# Patient Record
Sex: Female | Born: 1955 | Race: Black or African American | Hispanic: No | Smoking: Never smoker
Health system: Southern US, Community
[De-identification: ages and names within clinical notes are randomized; demographics above are authoritative.]

## PROBLEM LIST (undated history)

## (undated) DIAGNOSIS — Z923 Personal history of irradiation: Secondary | ICD-10-CM

## (undated) HISTORY — PX: BREAST BIOPSY: SHX20

## (undated) HISTORY — PX: BREAST LUMPECTOMY: SHX2

---

## 1999-05-29 ENCOUNTER — Encounter: Payer: Self-pay | Admitting: Obstetrics

## 1999-05-29 ENCOUNTER — Inpatient Hospital Stay (HOSPITAL_COMMUNITY): Admission: AD | Admit: 1999-05-29 | Discharge: 1999-05-29 | Payer: Self-pay | Admitting: Obstetrics

## 2001-07-16 ENCOUNTER — Encounter: Payer: Self-pay | Admitting: Specialist

## 2001-07-16 ENCOUNTER — Ambulatory Visit (HOSPITAL_COMMUNITY): Admission: RE | Admit: 2001-07-16 | Discharge: 2001-07-16 | Payer: Self-pay | Admitting: Specialist

## 2001-11-21 ENCOUNTER — Emergency Department (HOSPITAL_COMMUNITY): Admission: EM | Admit: 2001-11-21 | Discharge: 2001-11-21 | Payer: Self-pay | Admitting: *Deleted

## 2002-08-04 ENCOUNTER — Ambulatory Visit (HOSPITAL_COMMUNITY): Admission: RE | Admit: 2002-08-04 | Discharge: 2002-08-04 | Payer: Self-pay | Admitting: Specialist

## 2002-08-04 ENCOUNTER — Encounter: Payer: Self-pay | Admitting: Specialist

## 2005-02-10 ENCOUNTER — Ambulatory Visit (HOSPITAL_COMMUNITY): Admission: RE | Admit: 2005-02-10 | Discharge: 2005-02-10 | Payer: Self-pay | Admitting: Specialist

## 2007-12-09 DIAGNOSIS — C50919 Malignant neoplasm of unspecified site of unspecified female breast: Secondary | ICD-10-CM

## 2007-12-09 HISTORY — DX: Malignant neoplasm of unspecified site of unspecified female breast: C50.919

## 2008-10-25 ENCOUNTER — Encounter (INDEPENDENT_AMBULATORY_CARE_PROVIDER_SITE_OTHER): Payer: Self-pay | Admitting: Diagnostic Radiology

## 2008-10-25 ENCOUNTER — Encounter: Admission: RE | Admit: 2008-10-25 | Discharge: 2008-10-25 | Payer: Self-pay | Admitting: Obstetrics and Gynecology

## 2008-11-03 ENCOUNTER — Encounter: Admission: RE | Admit: 2008-11-03 | Discharge: 2008-11-03 | Payer: Self-pay | Admitting: Obstetrics and Gynecology

## 2008-11-24 ENCOUNTER — Encounter: Admission: RE | Admit: 2008-11-24 | Discharge: 2008-11-24 | Payer: Self-pay | Admitting: General Surgery

## 2008-11-28 ENCOUNTER — Encounter: Admission: RE | Admit: 2008-11-28 | Discharge: 2008-11-28 | Payer: Self-pay | Admitting: General Surgery

## 2008-11-28 ENCOUNTER — Ambulatory Visit (HOSPITAL_BASED_OUTPATIENT_CLINIC_OR_DEPARTMENT_OTHER): Admission: RE | Admit: 2008-11-28 | Discharge: 2008-11-28 | Payer: Self-pay | Admitting: General Surgery

## 2008-11-28 ENCOUNTER — Encounter (INDEPENDENT_AMBULATORY_CARE_PROVIDER_SITE_OTHER): Payer: Self-pay | Admitting: General Surgery

## 2008-12-14 ENCOUNTER — Ambulatory Visit: Payer: Self-pay | Admitting: Oncology

## 2008-12-22 ENCOUNTER — Ambulatory Visit: Admission: RE | Admit: 2008-12-22 | Discharge: 2009-03-06 | Payer: Self-pay | Admitting: Radiation Oncology

## 2008-12-22 LAB — COMPREHENSIVE METABOLIC PANEL
BUN: 16 mg/dL (ref 6–23)
CO2: 26 mEq/L (ref 19–32)
Calcium: 9.8 mg/dL (ref 8.4–10.5)
Creatinine, Ser: 0.82 mg/dL (ref 0.40–1.20)
Total Protein: 7.4 g/dL (ref 6.0–8.3)

## 2008-12-22 LAB — CBC WITH DIFFERENTIAL/PLATELET
Eosinophils Absolute: 0.1 10*3/uL (ref 0.0–0.5)
MONO#: 0.4 10*3/uL (ref 0.1–0.9)
MONO%: 10.1 % (ref 0.0–13.0)
Platelets: 310 10*3/uL (ref 145–400)
RDW: 12.6 % (ref 11.3–14.5)
WBC: 4.2 10*3/uL (ref 3.9–10.0)

## 2009-04-10 ENCOUNTER — Ambulatory Visit: Payer: Self-pay | Admitting: Oncology

## 2009-08-23 ENCOUNTER — Ambulatory Visit: Payer: Self-pay | Admitting: Oncology

## 2009-08-27 LAB — COMPREHENSIVE METABOLIC PANEL
AST: 18 U/L (ref 0–37)
Albumin: 3.9 g/dL (ref 3.5–5.2)
Alkaline Phosphatase: 44 U/L (ref 39–117)
CO2: 28 mEq/L (ref 19–32)
Calcium: 9.3 mg/dL (ref 8.4–10.5)
Chloride: 108 mEq/L (ref 96–112)
Sodium: 140 mEq/L (ref 135–145)
Total Bilirubin: 0.4 mg/dL (ref 0.3–1.2)
Total Protein: 7.4 g/dL (ref 6.0–8.3)

## 2009-08-27 LAB — CBC WITH DIFFERENTIAL/PLATELET
Basophils Absolute: 0 10*3/uL (ref 0.0–0.1)
Eosinophils Absolute: 0 10*3/uL (ref 0.0–0.5)
HCT: 37.4 % (ref 34.8–46.6)
MCH: 29.7 pg (ref 25.1–34.0)
MCV: 87.5 fL (ref 79.5–101.0)
MONO#: 0.4 10*3/uL (ref 0.1–0.9)
NEUT%: 63 % (ref 38.4–76.8)
RBC: 4.27 10*6/uL (ref 3.70–5.45)
lymph#: 0.9 10*3/uL (ref 0.9–3.3)

## 2009-10-15 ENCOUNTER — Encounter: Admission: RE | Admit: 2009-10-15 | Discharge: 2009-10-15 | Payer: Self-pay | Admitting: Oncology

## 2009-12-04 IMAGING — CR DG CHEST 2V
2 series · 2 of 2 positions shown · non-contrast
Comparison: None

CLINICAL DATA: Preop workup.  Right breast DCIS

CHEST - 2 VIEW

[w chest pa]
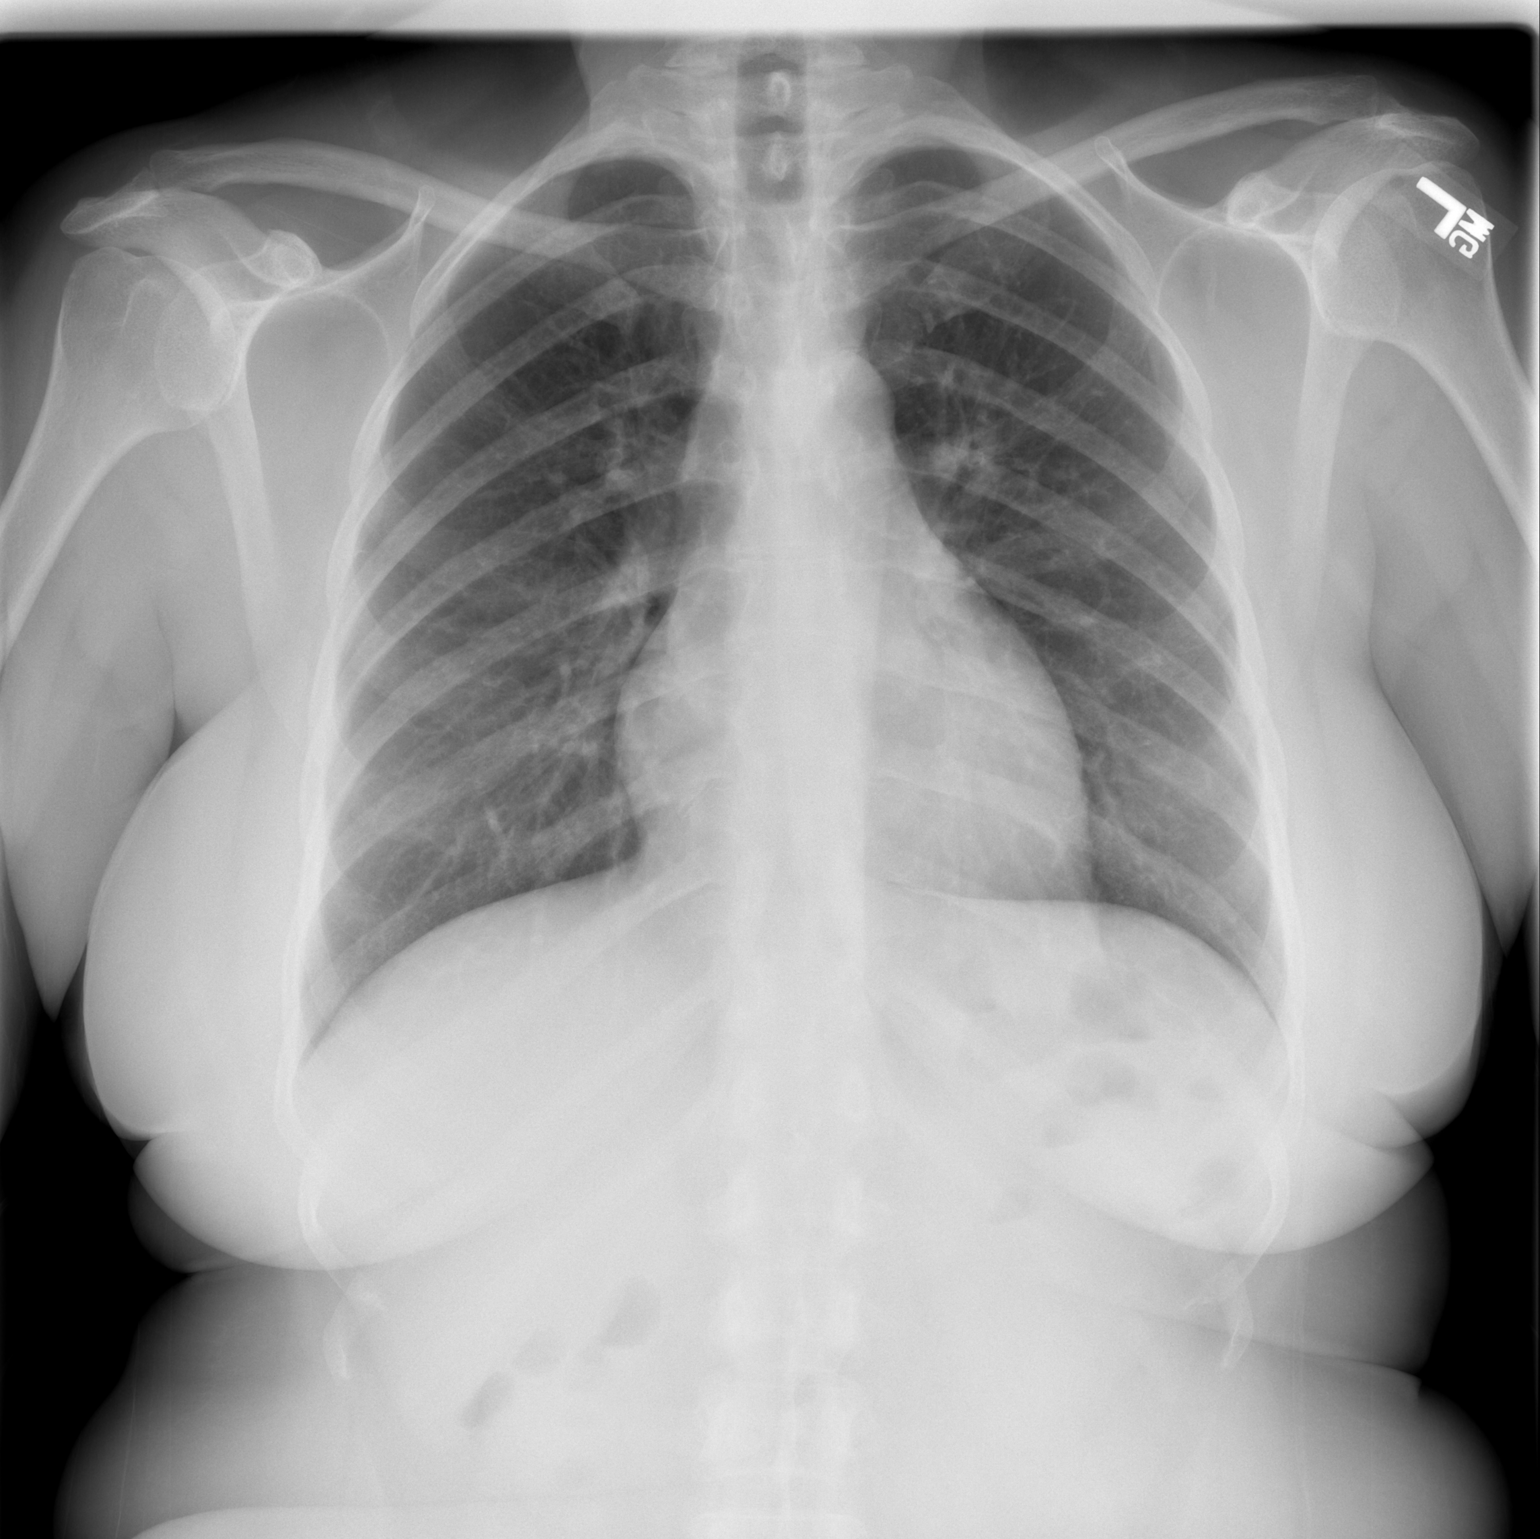

[w chest lat]
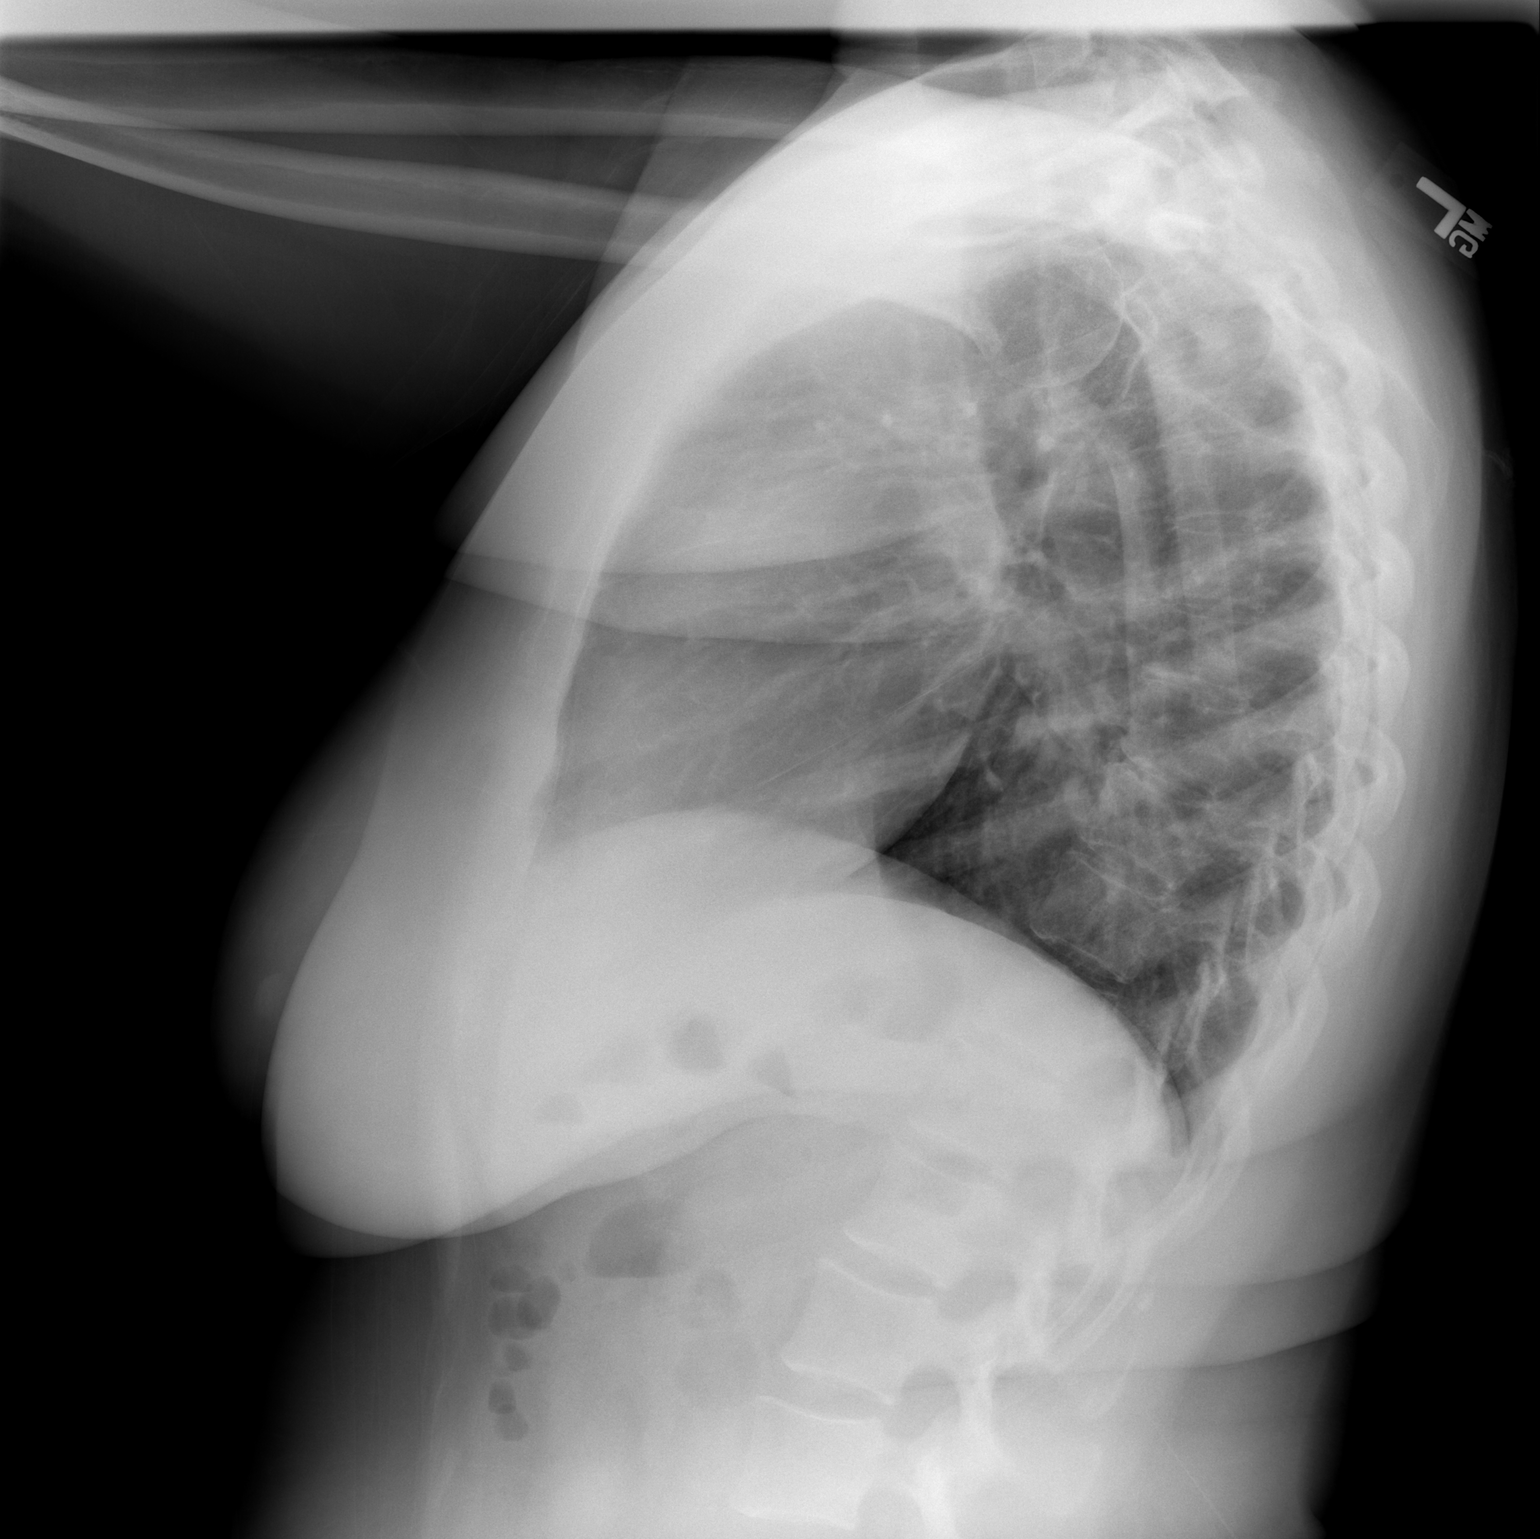

[2 of 2 positions shown; findings below may reference images not displayed]

FINDINGS: The heart size and mediastinal contours are within normal
limits.  Both lungs are clear.  The visualized skeletal structures
are unremarkable.
IMPRESSION: No active cardiopulmonary disease.

## 2010-01-04 ENCOUNTER — Ambulatory Visit: Payer: Self-pay | Admitting: Oncology

## 2010-01-24 LAB — CBC WITH DIFFERENTIAL/PLATELET
Eosinophils Absolute: 0 10*3/uL (ref 0.0–0.5)
HGB: 12.9 g/dL (ref 11.6–15.9)
MCHC: 34 g/dL (ref 31.5–36.0)
MCV: 88.7 fL (ref 79.5–101.0)
MONO#: 0.5 10*3/uL (ref 0.1–0.9)
NEUT#: 4.5 10*3/uL (ref 1.5–6.5)
Platelets: 188 10*3/uL (ref 145–400)
RBC: 4.26 10*6/uL (ref 3.70–5.45)

## 2010-01-24 LAB — COMPREHENSIVE METABOLIC PANEL
ALT: 22 U/L (ref 0–35)
Alkaline Phosphatase: 50 U/L (ref 39–117)
BUN: 9 mg/dL (ref 6–23)
Calcium: 9.5 mg/dL (ref 8.4–10.5)
Chloride: 106 mEq/L (ref 96–112)

## 2010-01-24 LAB — CANCER ANTIGEN 27.29: CA 27.29: 30 U/mL (ref 0–39)

## 2010-07-11 ENCOUNTER — Ambulatory Visit: Payer: Self-pay | Admitting: Oncology

## 2010-07-15 LAB — CBC WITH DIFFERENTIAL/PLATELET
BASO%: 0.2 % (ref 0.0–2.0)
EOS%: 1.9 % (ref 0.0–7.0)
HCT: 36.1 % (ref 34.8–46.6)
HGB: 12.1 g/dL (ref 11.6–15.9)
MCV: 89.4 fL (ref 79.5–101.0)
RDW: 12.8 % (ref 11.2–14.5)
WBC: 4 10*3/uL (ref 3.9–10.3)
lymph#: 1.3 10*3/uL (ref 0.9–3.3)

## 2010-07-15 LAB — COMPREHENSIVE METABOLIC PANEL
BUN: 20 mg/dL (ref 6–23)
CO2: 28 mEq/L (ref 19–32)
Calcium: 8.9 mg/dL (ref 8.4–10.5)
Sodium: 140 mEq/L (ref 135–145)
Total Bilirubin: 0.3 mg/dL (ref 0.3–1.2)

## 2010-07-15 LAB — CANCER ANTIGEN 27.29: CA 27.29: 29 U/mL (ref 0–39)

## 2010-10-30 ENCOUNTER — Encounter: Admission: RE | Admit: 2010-10-30 | Discharge: 2010-10-30 | Payer: Self-pay | Admitting: Oncology

## 2011-02-11 ENCOUNTER — Other Ambulatory Visit: Payer: Self-pay | Admitting: Oncology

## 2011-02-11 ENCOUNTER — Encounter (HOSPITAL_BASED_OUTPATIENT_CLINIC_OR_DEPARTMENT_OTHER): Payer: 59 | Admitting: Oncology

## 2011-02-11 DIAGNOSIS — Z171 Estrogen receptor negative status [ER-]: Secondary | ICD-10-CM

## 2011-02-11 DIAGNOSIS — C50519 Malignant neoplasm of lower-outer quadrant of unspecified female breast: Secondary | ICD-10-CM

## 2011-02-11 LAB — CBC WITH DIFFERENTIAL/PLATELET
BASO%: 0.2 % (ref 0.0–2.0)
HCT: 39.2 % (ref 34.8–46.6)
HGB: 13.1 g/dL (ref 11.6–15.9)
MCV: 87.7 fL (ref 79.5–101.0)
MONO#: 0.4 10*3/uL (ref 0.1–0.9)
MONO%: 9.2 % (ref 0.0–14.0)
Platelets: 240 10*3/uL (ref 145–400)
RDW: 12.6 % (ref 11.2–14.5)
WBC: 4.1 10*3/uL (ref 3.9–10.3)
nRBC: 0 % (ref 0–0)

## 2011-02-11 LAB — COMPREHENSIVE METABOLIC PANEL
ALT: 21 U/L (ref 0–35)
AST: 21 U/L (ref 0–37)
BUN: 9 mg/dL (ref 6–23)
CO2: 30 mEq/L (ref 19–32)
Creatinine, Ser: 0.81 mg/dL (ref 0.40–1.20)
Glucose, Bld: 92 mg/dL (ref 70–99)
Total Bilirubin: 0.6 mg/dL (ref 0.3–1.2)
Total Protein: 7.8 g/dL (ref 6.0–8.3)

## 2011-02-11 LAB — CANCER ANTIGEN 27.29: CA 27.29: 37 U/mL (ref 0–39)

## 2011-02-13 ENCOUNTER — Other Ambulatory Visit: Payer: Self-pay | Admitting: Oncology

## 2011-02-13 ENCOUNTER — Encounter (HOSPITAL_BASED_OUTPATIENT_CLINIC_OR_DEPARTMENT_OTHER): Payer: 59 | Admitting: Oncology

## 2011-02-13 DIAGNOSIS — Z171 Estrogen receptor negative status [ER-]: Secondary | ICD-10-CM

## 2011-02-13 DIAGNOSIS — Z9889 Other specified postprocedural states: Secondary | ICD-10-CM

## 2011-02-13 DIAGNOSIS — D0590 Unspecified type of carcinoma in situ of unspecified breast: Secondary | ICD-10-CM

## 2011-04-22 NOTE — Op Note (Signed)
Katherine Anderson, Katherine Anderson               ACCOUNT NO.:  [id number]   MEDICAL RECORD NO.:  [id number]          PATIENT TYPE:  AMB   LOCATION:  DSC                          FACILITY:  [facility]   PHYSICIAN:  Ollen Gross. Vernell Morgans, M.D. DATE OF BIRTH:  10-16-1956   DATE OF PROCEDURE:  11/28/2008  DATE OF DISCHARGE:                               OPERATIVE REPORT   PREOPERATIVE DIAGNOSIS:  Right breast ductal carcinoma in situ.   POSTOPERATIVE DIAGNOSIS:  Right breast ductal carcinoma in situ.   PROCEDURE:  Right breast needle-localized lumpectomy with sentinel node  biopsy and injection of blue dye.   SURGEON:  Ollen Gross. Vernell Morgans, MD   ANESTHESIA:  General via LMA.   DESCRIPTION OF PROCEDURE:  After informed consent was obtained, the  patient was brought to the operating room and placed in the supine  position on the operating room table.  After adequate induction of  general anesthesia, the patient's right breast, chest, and axilla were  prepped with Betadine and draped in usual sterile manner.  Earlier in  the day, the patient had undergone a wire localization procedure.  The  patient had an area of calcification in the lower outer aspect of the  right breast and there were 2 wires entering laterally that were  bracketing this area.  The patient also earlier in the day had undergone  injection of 1 mCi of technetium sulfur colloid in the subareolar  position.  At this point, 2 mL of methylene blue and 3 mL of injectable  saline were also injected in the subareolar position.  The breast was  massaged for several minutes.  NeoProbe device was then used to identify  a hot spot in the right axilla.  A small transverse incision was made  overlying this hot spot with a 15 blade knife.  This incision was  carried down through the skin and subcutaneous tissue sharply with the  electrocautery until the axilla was entered.  A Weitlaner retractor was  deployed using the NeoProbe to guide the dissection.   Blunt hemostat  dissection was carried out in the right axilla until a hot blue lymph  node was identified.  It was excised by a combination of sharp  dissection with the electrocautery and then the lymphatics were clamped  with hemostats, divided, and ligated with 3-0 Vicryl ties.  A second hot  lymph node that was not blue was also identified in the same general  area and this was also dissected free by a combination of sharp Bovie  dissection and blunt and then the lymphatics were clamped with  hemostats, divided, and ligated with 3-0 Vicryl ties.  These were sent  as sentinel node number 1 and 2.  Sentinel node #1 had ex vivo counts  around 430 and sentinel node #2 had ex vivo counts around 120.  No other  hot spots, palpable adenopathy or blue dye was identified.  The deep  layer of this incision was then closed with interrupted 3-0 Vicryl  stitches and the skin was closed with a running 4-0 Monocryl  subcuticular stitch.  The  wound was infiltrated also with 0.25%  Marcaine.  Attention was then turned to the right breast.  A radial  incision in the lower outer aspect of the breast was made along the path  of the wires.  This was done with a 15 blade knife.  This incision was  carried down through the skin into the subcutaneous tissue of the breast  sharply with electrocautery.  Once into the breast tissue, an oblong  portion of breast tissue was excised sharply in a circular fashion  around the path of the wires, this is the area encompassed by the  calcifications.  This dissection was taken down to the chest wall.  Once  the specimen was completely removed, it was oriented according to the  assigned paint colors.  Specimen radiograph showed the calcifications  and clip to be in the middle of the specimen.  On palpating the  specimen, it was felt that the hard mass was close to the superior  margin and additional superior margin was taken.  A stitch was placed on  the true surgical  margin.  Hemostasis was then achieved using the Bovie  electrocautery.  The wound was infiltrated 0.25% Marcaine.  The wound  was irrigated with copious amounts of saline.  The deep layer of the  wound was closed with interrupted  3-0 Vicryl stitches and the skin was closed with a running 4-0 Monocryl  subcuticular stitch.  Dermabond dressings were then applied.  The  patient tolerated the procedure well.  At the end of the case, all  needle, sponge, and instrument counts were correct.  The patient was  then awakened and taken to recovery in stable condition.      Ollen Gross. Vernell Morgans, M.D.  Electronically Signed     PST/MEDQ  D:  11/28/2008  T:  11/29/2008  Job:  [id number]

## 2011-04-25 NOTE — H&P (Signed)
[place]. [facility]  Patient:    Katherine Anderson, Katherine Anderson Visit Number: [id number] MRN: [id number]          Service Type: MED Location: 1800 [id number] [id number] Attending Physician:  Corlis Leak. Dictated by:   Renford Dills, M.D. Admit Date:  11/21/2001                           History and Physical  DATE OF BIRTH: 1956-11-25  CHIEF COMPLAINT: Chest pain.  HISTORY OF PRESENT ILLNESS: This is a 55 year old black female, with a history of osteoarthritis in bilateral knees, obesity, who presents to the ED with complaint of recurrent discomfort in the chest, which she rates 6-7/10, under the breast going to her back.  This has been happening for approximately three days off and on.  She denies any nausea or vomiting.  No palpitations.  Denies ever having any exertional symptoms.  Last time she had the discomfort was tonight when she went to lie down to go to bed and had chest pain, and she states that this pain was not like reflux.  It lasted approximately 35 minutes, somewhat waxing and waning in nature.  The patient presented to the ED because of the above complaint.  In retrospect, she denies any orthopnea, PND, can normally walk greater than five blocks without difficulty or two flights of stairs.  As stated, the patient presented to the ED because of the above complaints.  There she had EKG and screening blood work.  Medicine was called for further evaluation.  I did talk with the patients primary M.D.  PAST MEDICAL HISTORY: As stated above.  MEDICATIONS:  1. Sulindac one q.d. for approximately two years.  2. Multivitamin.  3. Vitamin C.  SOCIAL HISTORY: Negative for tobacco, alcohol, or drugs.  PAST SURGICAL HISTORY:  1. Tubal ligation in 1980.  2. Partial hysterectomy in 2000 secondary to fibroids and dysfunction uterine     bleeding.  ALLERGIES: No known drug allergies.  FAMILY HISTORY: Brother had myocardial infarction at 71, also suffers  from high blood pressure and diabetes.  Grandfather had several myocardial infarctions.  REVIEW OF SYSTEMS: Negative for change in stool or urine.  No cough, no fever, no chills.  No weight change.  No nausea or vomiting, diarrhea or constipation.  PHYSICAL EXAMINATION:  GENERAL: The patient is alert and oriented x 3, in no apparent distress, without any complaint of chest discomfort.  VITAL SIGNS: TEMP 98.6 degrees, BP 117/73, pulse 72, respiratory rate 18.  HEENT: Normocephalic, anicteric.  No oral lesions.  NECK: No carotid bruits.  CHEST: Clear.  No rash.  No pain with palpation.  CARDIOVASCULAR: Regular, S1 and S2, no S3.  No murmurs, rubs, or gallops.  ABDOMEN: Midline surgical scar.  Positive bowel sounds.  Nontender.  No hepatosplenomegaly.  EXTREMITIES: No clubbing, cyanosis, or edema.  RECTAL: Examination deferred.  BREAST: Examination deferred per patient request.  NEUROLOGIC: Nonfocal.  LABORATORY DATA: BMET within normal limits.  AST and ALT normal.  CBC normal.  EKG - normal sinus rhythm, normal axis, no Qs, no ST elevation, no other signs of acute changes.  Troponin I 0.02.  CK 124, MB 1.2, index 1.  Chest x-ray - no infiltrate, no widening of mediastinum, no cardiomegaly.  ASSESSMENT: Atypical chest pain for cardiac disease in a 55 year old postmenopausal woman, with family history of ear coronary disease. Differential diagnosis includes coronary disease, musculoskeletal disease, reflux versus esophageal  spasm.  PLAN: The patient will be admitted to a monitored bed and will have serial enzymes, will have nitro paste to chest, repeat EKG in the morning, aspirin, and Protonix.  May want to consider stress test for risk stratification if the patient rules out for ischemia.  Will make further recommendations after review of the above tests. Dictated by:   Renford Dills, M.D. Attending Physician:  Corlis Leak DD:  11/22/01 TD:  11/22/01 Job:  [id number]

## 2011-09-11 LAB — CBC
HCT: 37.9 % (ref 36.0–46.0)
MCHC: 33 g/dL (ref 30.0–36.0)
Platelets: 239 10*3/uL (ref 150–400)
RBC: 4.34 MIL/uL (ref 3.87–5.11)
WBC: 4.5 10*3/uL (ref 4.0–10.5)

## 2011-09-11 LAB — COMPREHENSIVE METABOLIC PANEL
ALT: 22 U/L (ref 0–35)
CO2: 29 mEq/L (ref 19–32)
Chloride: 105 mEq/L (ref 96–112)
Creatinine, Ser: 0.74 mg/dL (ref 0.4–1.2)
GFR calc Af Amer: >60 mL/min (ref >60)
GFR calc non Af Amer: >60 mL/min (ref >60)
Glucose, Bld: 96 mg/dL (ref 70–99)
Potassium: 4.4 mEq/L (ref 3.5–5.1)
Sodium: 138 mEq/L (ref 135–145)

## 2011-09-11 LAB — DIFFERENTIAL
Basophils Relative: 0 % (ref 0–1)
Eosinophils Relative: 2 % (ref 0–5)
Lymphocytes Relative: 36 % (ref 12–46)
Monocytes Absolute: 0.3 10*3/uL (ref 0.1–1.0)
Neutro Abs: 2.5 10*3/uL (ref 1.7–7.7)
Neutrophils Relative %: 55 % (ref 43–77)

## 2011-10-28 ENCOUNTER — Other Ambulatory Visit: Payer: Self-pay | Admitting: Obstetrics and Gynecology

## 2011-11-07 ENCOUNTER — Ambulatory Visit
Admission: RE | Admit: 2011-11-07 | Discharge: 2011-11-07 | Disposition: A | Discharge status: Home / Self Care | Payer: 59 | Source: Ambulatory Visit | Attending: Oncology | Admitting: Oncology

## 2011-11-07 DIAGNOSIS — Z9889 Other specified postprocedural states: Secondary | ICD-10-CM

## 2012-01-15 ENCOUNTER — Other Ambulatory Visit (HOSPITAL_BASED_OUTPATIENT_CLINIC_OR_DEPARTMENT_OTHER): Payer: 59 | Admitting: Lab

## 2012-01-15 DIAGNOSIS — Z170 Estrogen receptor positive status [ER+]: Secondary | ICD-10-CM

## 2012-01-15 DIAGNOSIS — C50519 Malignant neoplasm of lower-outer quadrant of unspecified female breast: Secondary | ICD-10-CM

## 2012-01-15 LAB — CBC WITH DIFFERENTIAL/PLATELET
BASO%: 0.2 % (ref 0.0–2.0)
EOS%: 0.9 % (ref 0.0–7.0)
MCH: 28.7 pg (ref 25.1–34.0)
MCHC: 32.6 g/dL (ref 31.5–36.0)
NEUT%: 54.8 % (ref 38.4–76.8)
RDW: 12.7 % (ref 11.2–14.5)
lymph#: 1.7 10*3/uL (ref 0.9–3.3)

## 2012-01-15 LAB — COMPREHENSIVE METABOLIC PANEL
AST: 17 U/L (ref 0–37)
Albumin: 4 g/dL (ref 3.5–5.2)
Alkaline Phosphatase: 61 U/L (ref 39–117)
BUN: 11 mg/dL (ref 6–23)
Potassium: 3.6 mEq/L (ref 3.5–5.3)
Sodium: 138 mEq/L (ref 135–145)

## 2012-01-22 ENCOUNTER — Telehealth: Payer: Self-pay | Admitting: Oncology

## 2012-01-22 ENCOUNTER — Ambulatory Visit (HOSPITAL_BASED_OUTPATIENT_CLINIC_OR_DEPARTMENT_OTHER): Payer: 59 | Admitting: Oncology

## 2012-01-22 VITALS — BP 136/86 | HR 70 | Temp 98.0°F | Ht 67.5 in | Wt 206.1 lb

## 2012-01-22 DIAGNOSIS — C50919 Malignant neoplasm of unspecified site of unspecified female breast: Secondary | ICD-10-CM

## 2012-01-22 MED ORDER — TAMOXIFEN CITRATE 20 MG PO TABS: 20.0000 mg | ORAL_TABLET | Freq: Every day | ORAL | Status: AC

## 2012-01-22 NOTE — Telephone Encounter (Signed)
gve the pt her feb 2014 appt calendar °

## 2012-01-24 DIAGNOSIS — C50919 Malignant neoplasm of unspecified site of unspecified female breast: Secondary | ICD-10-CM | POA: Insufficient documentation

## 2012-01-24 NOTE — Progress Notes (Signed)
ID: Katherine Anderson   DOB: [birth year]  MR#: [id number]  WUJ#:[id number]  HISTORY OF PRESENT ILLNESS:  The patient had screening mammography 10/12/2008, showing new calcifications in the right breast (prior study was 02/2005).  Diagnostic exam 10/25/2008 confirmed suspicious pleomorphic microcalcifications in the right lower outer quadrant, and stereotactic biopsy was performed the same day showing ([id number] and [id number]) a high grade ductal carcinoma in situ which was estrogen receptor "positive" at 3%, and PR negative at 0%.  Bilateral breast MRI was obtained on 11/03/2008.  This showed only a solitary area of abnormal enhancement in the right lower outer quadrant measuring up to 1.7 cm.  With this information, the patient proceeded to right needle localized lumpectomy and sentinel lymph node biopsy 11/28/2008 under Dr. Carolynne Edouard for [phone]) a 3 cm area of ductal carcinoma in situ, no invasive component, grade 3, with a  positive posterior margin which was according to the surgical note at the chest wall (so that further margin clearance was not an option).  Two sentinel lymph nodes were negative, and repeat prognostic panel showed ER at 0% and PR at 0%.  INTERVAL HISTORY: Katherine Anderson returns for followup of her ductal carcinoma in situ. The interval history is "really good". Unfortunately  her father died about a year ago. On the plus side,  she now has 4 grandchildren. Her work is "fine" and she is having some tonic social interactions  REVIEW OF SYSTEMS: She is tolerating the tamoxifen without significant hot flashes, and with no vaginal dryness or wetness. She is trying to "work done in my weight". The detailed review of systems today was entirely noncontributory  PAST MEDICAL HISTORY: Hypertension, history of tubal ligation, history of simple hysterectomy without salpingo-oophorectomy for fibroids in 2000  FAMILY HISTORY The patient's father died at age 62.  The patient's mother is alive at age 36.   She has a history of non-Hodgkin lymphoma.  The patient is one of five sisters, and also has four brothers.  There is no history of breast or ovarian cancer in the family.  GYNECOLOGIC HISTORY: She is GX, P2, first pregnancy at age 25.  She has had some hot flashes. She never took estrogen after her hysterectomy.  She does not know whether she is biologically post or premenopausal at this point.    SOCIAL HISTORY: She is a Occupational psychologist for [employer]. Her husband died nearly 10 years ago from a massive heart attack. The patient's daughter, Katherine Anderson, works in [place] as an Tourist information centre manager.  The patient's son, Katherine, Anderson., works for [employer].  The patient has 4 grandchildren.  She attends a [employer].   ADVANCED DIRECTIVES: not in place  HEALTH MAINTENANCE: History  Substance Use Topics  . Smoking status: Not on file  . Smokeless tobacco: Not on file  . Alcohol Use: Not on file     Colonoscopy:  PAP: UTD  Bone density:  Lipid panel:  Allergies no known allergies  Current Outpatient Prescriptions  Medication Sig Dispense Refill  . zolpidem (AMBIEN) 10 MG tablet Take 10 mg by mouth at bedtime as needed.      Marland Kitchen lisinopril-hydrochlorothiazide (PRINZIDE,ZESTORETIC) 20-12.5 MG per tablet       . tamoxifen (NOLVADEX) 20 MG tablet Take 1 tablet (20 mg total) by mouth daily.  90 tablet  12    OBJECTIVE: Filed Vitals:   01/22/12 1541  BP: 136/86  Pulse: 70  Temp: 98 F (36.7 C)     Body  mass index is 31.80 kg/(m^2).    ECOG FS:0  Sclerae unicteric Oropharynx clear No peripheral adenopathy Lungs no rales or rhonchi Heart regular rate and rhythm Abd benign MSK no focal spinal tenderness, no peripheral edema Neuro: nonfocal Breasts: The right breast is status post lumpectomy and radiation. There is no evidence of local recurrence. The left breast is unremarkable  LAB RESULTS: Lab Results  Component Value Date   WBC 4.7  01/15/2012   NEUTROABS 2.6 01/15/2012   HGB 12.9 01/15/2012   HCT 39.6 01/15/2012   MCV 88.0 01/15/2012   PLT 231 01/15/2012      Chemistry      Component Value Date/Time   NA 138 01/15/2012 1602   K 3.6 01/15/2012 1602   CL 102 01/15/2012 1602   CO2 29 01/15/2012 1602   BUN 11 01/15/2012 1602   CREATININE 0.84 01/15/2012 1602      Component Value Date/Time   CALCIUM 9.9 01/15/2012 1602   ALKPHOS 61 01/15/2012 1602   AST 17 01/15/2012 1602   ALT 18 01/15/2012 1602   BILITOT 0.4 01/15/2012 1602       No results found for this basename: INR:1;PROTIME:1 in the last 168 hours  No results found for this basename: UACOL:1,UAPR:1,USPG:1,UPH:1,UTP:1,UGL:1,UKET:1,UBIL:1,UHGB:1,UNIT:1,UROB:1,ULEU:1,UEPI:1,UWBC:1,URBC:1,UBAC:1,CAST:1,CRYS:1,UCOM:1,BILUA:1 in the last 72 hours   STUDIES: Mammography 11/07/2011 showed no evidence of disease  ASSESSMENT: A 56 year old [place] woman status post lumpectomy and sentinel lymph node biopsy December 2009 for a 3 cm area of ductal carcinoma in situ, grade 3, estrogen and progesterone negative.  Radiation was completed in March 2010 after which she started on tamoxifen.  She participated in the [facility] 0801 protocol and was found to be an intermediate metabolizer.  Started on 40 mg daily as of October 2010, went back to 20 mg daily in August 2011.  T  PLAN: She is doing very well from my point of view and the plan is to continue tamoxifen for a total of 5 years. She knows to call for any problems that may develop before the next visit   [name]    01/24/2012

## 2012-04-06 ENCOUNTER — Other Ambulatory Visit: Payer: Self-pay | Admitting: Oncology

## 2012-10-05 ENCOUNTER — Other Ambulatory Visit: Payer: Self-pay | Admitting: Oncology

## 2012-10-05 DIAGNOSIS — Z853 Personal history of malignant neoplasm of breast: Secondary | ICD-10-CM

## 2012-10-05 DIAGNOSIS — Z9889 Other specified postprocedural states: Secondary | ICD-10-CM

## 2012-11-29 ENCOUNTER — Ambulatory Visit
Admission: RE | Admit: 2012-11-29 | Discharge: 2012-11-29 | Disposition: A | Discharge status: Home / Self Care | Payer: 59 | Source: Ambulatory Visit | Attending: Oncology | Admitting: Oncology

## 2012-11-29 DIAGNOSIS — Z9889 Other specified postprocedural states: Secondary | ICD-10-CM

## 2012-11-29 DIAGNOSIS — Z853 Personal history of malignant neoplasm of breast: Secondary | ICD-10-CM

## 2013-01-14 ENCOUNTER — Other Ambulatory Visit: Payer: Self-pay | Admitting: *Deleted

## 2013-01-14 DIAGNOSIS — C50919 Malignant neoplasm of unspecified site of unspecified female breast: Secondary | ICD-10-CM

## 2013-01-17 ENCOUNTER — Other Ambulatory Visit (HOSPITAL_BASED_OUTPATIENT_CLINIC_OR_DEPARTMENT_OTHER): Payer: 59 | Admitting: Lab

## 2013-01-17 DIAGNOSIS — C50919 Malignant neoplasm of unspecified site of unspecified female breast: Secondary | ICD-10-CM

## 2013-01-17 LAB — CBC WITH DIFFERENTIAL/PLATELET
Basophils Absolute: 0 10*3/uL (ref 0.0–0.1)
Eosinophils Absolute: 0.1 10*3/uL (ref 0.0–0.5)
HGB: 12.4 g/dL (ref 11.6–15.9)
LYMPH%: 37 % (ref 14.0–49.7)
MCV: 87.8 fL (ref 79.5–101.0)
MONO%: 10.9 % (ref 0.0–14.0)
NEUT#: 2.2 10*3/uL (ref 1.5–6.5)
NEUT%: 50.1 % (ref 38.4–76.8)
Platelets: 203 10*3/uL (ref 145–400)
RBC: 4.29 10*6/uL (ref 3.70–5.45)

## 2013-01-17 LAB — COMPREHENSIVE METABOLIC PANEL (CC13)
BUN: 15.4 mg/dL (ref 7.0–26.0)
CO2: 28 mEq/L (ref 22–29)
Creatinine: 0.9 mg/dL (ref 0.6–1.1)
Glucose: 88 mg/dl (ref 70–99)
Total Bilirubin: <0.2 mg/dL (ref 0.20–1.20)

## 2013-01-24 ENCOUNTER — Ambulatory Visit (HOSPITAL_BASED_OUTPATIENT_CLINIC_OR_DEPARTMENT_OTHER): Payer: 59 | Admitting: Oncology

## 2013-01-24 ENCOUNTER — Telehealth: Payer: Self-pay | Admitting: Oncology

## 2013-01-24 VITALS — BP 138/83 | HR 65 | Temp 98.5°F | Resp 20 | Ht 67.5 in | Wt 214.9 lb

## 2013-01-24 DIAGNOSIS — C50919 Malignant neoplasm of unspecified site of unspecified female breast: Secondary | ICD-10-CM

## 2013-01-24 DIAGNOSIS — D0590 Unspecified type of carcinoma in situ of unspecified breast: Secondary | ICD-10-CM

## 2013-01-24 DIAGNOSIS — Z170 Estrogen receptor positive status [ER+]: Secondary | ICD-10-CM

## 2013-01-24 MED ORDER — TAMOXIFEN CITRATE 20 MG PO TABS
20.0000 mg | ORAL_TABLET | Freq: Every day | ORAL | Status: AC
Start: 2013-01-24 — End: ?

## 2013-01-24 NOTE — Telephone Encounter (Signed)
gv pt appt schedule for February 2015.  °

## 2013-01-24 NOTE — Progress Notes (Signed)
ID: Katherine Anderson   DOB: 1956-03-03  MR#: [id number]  CSN#:[id number]   PCP: No primary provider on file. GYN: Duane Lope SU: OTHER MD:  HISTORY OF PRESENT ILLNESS:  The patient had screening mammography 10/12/2008, showing new calcifications in the right breast (prior study was 02/2005).  Diagnostic exam 10/25/2008 confirmed suspicious pleomorphic microcalcifications in the right lower outer quadrant, and stereotactic biopsy was performed the same day showing ([id number] and [id number]) a high grade ductal carcinoma in situ which was estrogen receptor "positive" at 3%, and PR negative at 0%.  Bilateral breast MRI was obtained on 11/03/2008.  This showed only a solitary area of abnormal enhancement in the right lower outer quadrant measuring up to 1.7 cm.  With this information, the patient proceeded to right needle localized lumpectomy and sentinel lymph node biopsy 11/28/2008 under Dr. Carolynne Edouard for [phone]) a 3 cm area of ductal carcinoma in situ, no invasive component, grade 3, with a  positive posterior margin which was according to the surgical note at the chest wall (so that further margin clearance was not an option).  Two sentinel lymph nodes were negative, and repeat prognostic panel showed ER at 0% and PR at 0%.  INTERVAL HISTORY: Katherine Anderson returns for followup of her ductal carcinoma in situ. The interval history is unremarkable. She either walks or uses a treadmill or alert the call that she has at home, almost on a daily basis. She enjoys her work. She has an improved social life. She enjoys her 4 grandchildren.  REVIEW OF SYSTEMS: She is tolerating the tamoxifen without significant hot flashes, and with no vaginal dryness or wetness. A detailed review of systems today was entirely noncontributory  PAST MEDICAL HISTORY: Hypertension, history of tubal ligation, history of simple hysterectomy without salpingo-oophorectomy for fibroids in 2000  FAMILY HISTORY The patient's father died at  age 68.  The patient's mother is alive at age 67.  She has a history of non-Hodgkin lymphoma.  The patient is one of five sisters, and also has four brothers.  There is no history of breast or ovarian cancer in the family.  GYNECOLOGIC HISTORY: She is GX, P2, first pregnancy at age 43.  She has had some hot flashes. She never took estrogen after her hysterectomy.    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 The patient's daughter, Katherine Anderson, works in [place] as an Tourist information centre manager.  The patient's son, Katherine Anderson, [place]., works for [employer].  The patient has 4 grandchildren.  She attends a [employer].   ADVANCED DIRECTIVES: not in place  HEALTH MAINTENANCE: History  Substance Use Topics  . Smoking status: Not on file  . Smokeless tobacco: Not on file  . Alcohol Use: Not on file     Colonoscopy:  PAP: UTD  Bone density:  Lipid panel:  Allergies no known allergies  Current Outpatient Prescriptions  Medication Sig Dispense Refill  . lisinopril-hydrochlorothiazide (PRINZIDE,ZESTORETIC) 20-12.5 MG per tablet       . zolpidem (AMBIEN) 10 MG tablet Take 10 mg by mouth at bedtime as needed.       No current facility-administered medications for this visit.    OBJECTIVE: Middle-aged [place] American woman who appears well Filed Vitals:   01/24/13 1553  BP: 138/83  Pulse: 65  Temp: 98.5 F (36.9 C)  Resp: 20     Body mass index is 33.14 kg/(m^2).  ECOG FS:0  Sclerae unicteric Oropharynx clear No peripheral adenopathy Lungs no rales or rhonchi Heart regular rate and rhythm Abd benign MSK no focal spinal tenderness, no peripheral edema Neuro: nonfocal Breasts: The right breast is status post lumpectomy and radiation. There is no evidence of local recurrence. The right axilla is benign The left breast is unremarkable  LAB RESULTS: Lab Results   Component Value Date   WBC 4.5 01/17/2013   NEUTROABS 2.2 01/17/2013   HGB 12.4 01/17/2013   HCT 37.7 01/17/2013   MCV 87.8 01/17/2013   PLT 203 01/17/2013      Chemistry      Component Value Date/Time   NA 144 01/17/2013 1552   NA 138 01/15/2012 1602   K 3.4* 01/17/2013 1552   K 3.6 01/15/2012 1602   CL 110* 01/17/2013 1552   CL 102 01/15/2012 1602   CO2 28 01/17/2013 1552   CO2 29 01/15/2012 1602   BUN 15.4 01/17/2013 1552   BUN 11 01/15/2012 1602   CREATININE 0.9 01/17/2013 1552   CREATININE 0.84 01/15/2012 1602      Component Value Date/Time   CALCIUM 9.4 01/17/2013 1552   CALCIUM 9.9 01/15/2012 1602   ALKPHOS 78 01/17/2013 1552   ALKPHOS 61 01/15/2012 1602   AST 17 01/17/2013 1552   AST 17 01/15/2012 1602   ALT 14 01/17/2013 1552   ALT 18 01/15/2012 1602   BILITOT <0.20 Repeated and Verified 01/17/2013 1552   BILITOT 0.4 01/15/2012 1602       No results found for this basename: INR,  in the last 168 hours  No results found for this basename: UACOL, UAPR, USPG, UPH, UTP, UGL, UKET, UBIL, UHGB, UNIT, UROB, ULEU, UEPI, UWBC, URBC, UBAC, CAST, CRYS, UCOM, BILUA,  in the last 72 hours   STUDIES: Mammography 11/29/2012 showed no evidence of disease  ASSESSMENT: A 57 year old [place] woman status post lumpectomy and sentinel lymph node biopsy December 2009 for a 3 cm area of ductal carcinoma in situ, grade 3, estrogen reeceptor 3% positive and progesterone negative.  Radiation was completed in March 2010 after which she started on tamoxifen.  She participated in the [facility] 0801 protocol and was found to be an intermediate metabolizer.  Started on 40 mg daily as of October 2010, went back to 20 mg daily in August 2011.   PLAN: There is no evidence of disease recurrence, and the plan is to complete 5 years of tamoxifen. She will see [place] again one more time, next February, and at that point she will "graduate". She knows to call for any problems that may develop before the next visit.   Philip Kotlyar C     01/24/2013

## 2013-03-31 ENCOUNTER — Other Ambulatory Visit: Payer: Self-pay | Admitting: *Deleted

## 2013-03-31 NOTE — Telephone Encounter (Signed)
Called [facility]. They had sent a refill request for Tamoxifen. In the computer, the rx was written 01/24/13 with 12 refills. I provided this information to pharmacist.

## 2013-10-10 ENCOUNTER — Other Ambulatory Visit: Payer: Self-pay | Admitting: Oncology

## 2013-10-10 DIAGNOSIS — Z853 Personal history of malignant neoplasm of breast: Secondary | ICD-10-CM

## 2013-11-24 ENCOUNTER — Other Ambulatory Visit: Payer: Self-pay | Admitting: Obstetrics and Gynecology

## 2013-11-24 ENCOUNTER — Other Ambulatory Visit: Payer: Self-pay | Admitting: Oncology

## 2013-11-24 ENCOUNTER — Other Ambulatory Visit: Payer: Self-pay

## 2013-11-24 DIAGNOSIS — Z853 Personal history of malignant neoplasm of breast: Secondary | ICD-10-CM

## 2013-11-30 ENCOUNTER — Ambulatory Visit
Admission: RE | Admit: 2013-11-30 | Discharge: 2013-11-30 | Disposition: A | Discharge status: Home / Self Care | Payer: 59 | Source: Ambulatory Visit | Attending: Oncology | Admitting: Oncology

## 2013-11-30 DIAGNOSIS — Z853 Personal history of malignant neoplasm of breast: Secondary | ICD-10-CM

## 2013-12-07 ENCOUNTER — Telehealth: Payer: Self-pay | Admitting: *Deleted

## 2013-12-07 NOTE — Telephone Encounter (Signed)
sw pt informed her that her arrival time for 01/24/14 change to 1:15pm. Pt is aware...td

## 2014-01-02 ENCOUNTER — Other Ambulatory Visit: Payer: Self-pay | Admitting: Physician Assistant

## 2014-01-03 ENCOUNTER — Telehealth: Payer: Self-pay | Admitting: Physician Assistant

## 2014-01-17 ENCOUNTER — Other Ambulatory Visit (HOSPITAL_BASED_OUTPATIENT_CLINIC_OR_DEPARTMENT_OTHER): Payer: 59

## 2014-01-17 DIAGNOSIS — C50919 Malignant neoplasm of unspecified site of unspecified female breast: Secondary | ICD-10-CM

## 2014-01-17 DIAGNOSIS — D0590 Unspecified type of carcinoma in situ of unspecified breast: Secondary | ICD-10-CM

## 2014-01-17 LAB — CBC & DIFF AND RETIC
BASO%: 0.2 % (ref 0.0–2.0)
BASOS ABS: 0 10*3/uL (ref 0.0–0.1)
EOS%: 1.7 % (ref 0.0–7.0)
Eosinophils Absolute: 0.1 10*3/uL (ref 0.0–0.5)
HCT: 40.9 % (ref 34.8–46.6)
HEMOGLOBIN: 13.1 g/dL (ref 11.6–15.9)
Immature Retic Fract: 5.7 % (ref 1.60–10.00)
LYMPH%: 41.9 % (ref 14.0–49.7)
MCH: 28.7 pg (ref 25.1–34.0)
MCHC: 32 g/dL (ref 31.5–36.0)
MCV: 89.5 fL (ref 79.5–101.0)
MONO#: 0.4 10*3/uL (ref 0.1–0.9)
MONO%: 8.6 % (ref 0.0–14.0)
NEUT#: 1.9 10*3/uL (ref 1.5–6.5)
NEUT%: 47.6 % (ref 38.4–76.8)
Platelets: 227 10*3/uL (ref 145–400)
RBC: 4.57 10*6/uL (ref 3.70–5.45)
RDW: 12.7 % (ref 11.2–14.5)
RETIC %: 1.12 % (ref 0.70–2.10)
Retic Ct Abs: 51.18 10*3/uL (ref 33.70–90.70)
WBC: 4.1 10*3/uL (ref 3.9–10.3)
lymph#: 1.7 10*3/uL (ref 0.9–3.3)
nRBC: 0 % (ref 0–0)

## 2014-01-24 ENCOUNTER — Ambulatory Visit: Payer: 59 | Admitting: Physician Assistant

## 2014-01-24 ENCOUNTER — Ambulatory Visit: Payer: 59 | Admitting: Oncology

## 2014-01-30 ENCOUNTER — Encounter: Payer: Self-pay | Admitting: Physician Assistant

## 2014-01-30 ENCOUNTER — Encounter (INDEPENDENT_AMBULATORY_CARE_PROVIDER_SITE_OTHER): Payer: Self-pay

## 2014-01-30 ENCOUNTER — Ambulatory Visit (HOSPITAL_BASED_OUTPATIENT_CLINIC_OR_DEPARTMENT_OTHER): Payer: 59 | Admitting: Physician Assistant

## 2014-01-30 VITALS — BP 127/84 | HR 62 | Temp 98.2°F | Resp 18 | Ht 67.5 in | Wt 221.0 lb

## 2014-01-30 DIAGNOSIS — C50919 Malignant neoplasm of unspecified site of unspecified female breast: Secondary | ICD-10-CM

## 2014-01-30 DIAGNOSIS — D0590 Unspecified type of carcinoma in situ of unspecified breast: Secondary | ICD-10-CM

## 2014-01-30 NOTE — Progress Notes (Signed)
ID: Katherine Anderson   DOB: Oct 08, 1956  MR#: [id number]  CSN#:[id number]   PCP: Lujean Amel, MD GYN: Melinda Crutch, MD SU: Autumn Messing, MD OTHER MD:  CHIEF COMPLAINT:  Hx of Right Breast Cancer   HISTORY OF PRESENT ILLNESS:  The patient had screening mammography 10/12/2008, showing new calcifications in the right breast (prior study was 02/2005).  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  Bilateral breast MRI was obtained on 11/03/2008.  This showed only a solitary area of abnormal enhancement in the right lower outer quadrant measuring up to 1.7 cm.  With this information, the patient proceeded to right needle localized lumpectomy and sentinel lymph node biopsy 11/28/2008 under Dr. 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  Two sentinel lymph nodes were negative, and repeat prognostic panel showed ER at 0% and PR at 0%.  Subsequent history is as detailed below.  INTERVAL HISTORY:  [name] returns today for routine followup of her right breast carcinoma. She continues on tamoxifen which she has tolerated well. Specifically, she has no increased hot flashes, no evidence of blood clots,  And no vaginal dryness, bleeding, or discharge.   Physically she is feeling well, and in fact has no new complaints today. She is staying busy, working a full-time job, and is trying to exercise a regular basis.   REVIEW OF SYSTEMS:  Katherine Anderson  has had no recent illnesses and denies any fevers or chills. She's had no skin changes or rashes and denies any  abnormal bruising or bleeding. Her energy level is good. Her appetite is good. She denies any nausea or change in bowel or bladder habits. She has had no cough, shortness of breath, peripheral swelling, chest pain, or palpitations. She denies abnormal headaches, dizziness, or change in vision. She also denies any unusual myalgias, arthralgias, or bony pain.  A detailed review of systems is otherwise stable and noncontributory.    PAST MEDICAL HISTORY: Hypertension, history of tubal ligation, history of simple hysterectomy without salpingo-oophorectomy for fibroids in 2000  FAMILY HISTORY The patient's father died at age 71.  The patient's mother is alive at age 36.  She has a history of non-Hodgkin lymphoma.  The patient is one of five sisters, and also has four brothers.  There is no history of breast or ovarian cancer in the family.  GYNECOLOGIC HISTORY: She is GX, P2, first pregnancy at age 51.  She has had some hot flashes. She never took estrogen after her hysterectomy.    SOCIAL HISTORY:  (Updated February 2015) She is a Radiation protection practitioner for [employer]. Her husband died nearly 79 years ago from a massive heart attack. The patient's daughter, [name], works in [place] as an Automotive engineer.  The patient's son, [name], [name]., works for [employer].  The patient has 4 grandchildren.  She attends a [employer].   ADVANCED DIRECTIVES: not in place  HEALTH MAINTENANCE: (Updated February 2015)  History  Substance Use Topics  . Smoking status: Never Smoker   . Smokeless tobacco: Never Used  . Alcohol Use: No  Colonoscopy: Not on file  PAP: December 2014, Dr. Harrington Challenger   Bone density: not on file   Lipid panel: not on file   No Known Allergies  Current Outpatient Prescriptions  Medication Sig Dispense Refill  . hydrochlorothiazide (MICROZIDE) 12.5 MG capsule       . tamoxifen (NOLVADEX) 20 MG tablet Take 1 tablet (20 mg total) by mouth  daily.  30 tablet  12  . zolpidem (AMBIEN) 10 MG tablet Take 10 mg by mouth at bedtime as needed.       No current facility-administered medications for this visit.    OBJECTIVE: Middle-aged [place] American woman who appears comfortable and is in no acute distress Filed Vitals:   01/30/14 1342  BP: 127/84  Pulse: 62  Temp: 98.2 F (36.8 C)  Resp: 18     Body mass index is 34.08 kg/(m^2).    ECOG FS:0 Filed Weights   01/30/14 1342  Weight: 221 lb (100.245 kg)   Physical Exam: HEENT:  Sclerae anicteric.  Oropharynx clear and moist. Neck supple, trachea midline, with no thyromegaly.  NODES:  No cervical or supraclavicular lymphadenopathy palpated.  BREAST EXAM: Right breast is status post lumpectomy with well-healed incision. No suspicious nodularity or skin changes, no evidence of local recurrence. Left breast is unremarkable. Axillae are benign bilaterally, with no palpable lymphadenopathy. LUNGS:  Clear to auscultation bilaterally.  No wheezes or rhonchi HEART:  Regular rate and rhythm. No murmur  ABDOMEN:  Soft, nontender.  Positive bowel sounds.  MSK:  No focal spinal tenderness to palpation. Good range of motion bilaterally in the upper chest remedies. EXTREMITIES:  No peripheral edema.   SKIN:  Benign with no visible rashes or skin lesions. No excessive ecchymoses and no petechiae. No pallor. NEURO:  Nonfocal. Well oriented.  Positive affect.   LAB RESULTS: Lab Results  Component Value Date   WBC 4.1 01/17/2014   NEUTROABS 1.9 01/17/2014   HGB 13.1 01/17/2014   HCT 40.9 01/17/2014   MCV 89.5 01/17/2014   PLT 227 01/17/2014      Chemistry      Component Value Date/Time   NA 144 01/17/2013 1552   NA 138 01/15/2012 1602   K 3.4* 01/17/2013 1552   K 3.6 01/15/2012 1602   CL 110* 01/17/2013 1552   CL 102 01/15/2012 1602   CO2 28 01/17/2013 1552   CO2 29 01/15/2012 1602   BUN 15.4 01/17/2013 1552   BUN 11 01/15/2012 1602   CREATININE 0.9 01/17/2013 1552   CREATININE 0.84 01/15/2012  1602      Component Value Date/Time   CALCIUM 9.4 01/17/2013 1552   CALCIUM 9.9 01/15/2012 1602   ALKPHOS 78 01/17/2013 1552   ALKPHOS 61 01/15/2012 1602   AST 17 01/17/2013 1552   AST 17 01/15/2012 1602   ALT 14 01/17/2013 1552   ALT 18 01/15/2012 1602   BILITOT <0.20 Repeated and Verified 01/17/2013 1552   BILITOT 0.4 01/15/2012 1602         STUDIES:  Most recent bilateral mammogram on 11/30/2013 was unremarkable.    ASSESSMENT: A 58 year old [place] woman   (1)  status post lumpectomy and sentinel lymph node biopsy December 2009 for a 3 cm area of ductal carcinoma in situ, grade 3, estrogen reeceptor 3% positive and progesterone negative.    (2)  Radiation was completed in March 2010 after which she started on tamoxifen.    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  Started on 40 mg daily as of October 2010, went back to 20 mg daily in August 2011.   PLAN:  This case was reviewed with Dr. Jana Hakim, and we are ready to "graduate" Avea from followup at this time. She has completed a total of 5 years on tamoxifen and will discontinue that drug. We will send a letter to her primary care physician alerting him that we have released her from followup. Of course she will still need an annual physician breast exam, as well as an annual mammogram which will be due again in December 2015.  Trilby understands that we will keep her records for at least 10 years, and we will remain her oncologists. She can certainly call with any changes or problems as needed. She voices her understanding and agreement with the above, and is comfortable with making no additional appointments at this time.    Corday Wyka PA-C     01/30/2014

## 2014-10-02 ENCOUNTER — Other Ambulatory Visit: Payer: Self-pay

## 2014-10-02 ENCOUNTER — Other Ambulatory Visit: Payer: Self-pay | Admitting: Oncology

## 2014-10-02 DIAGNOSIS — Z853 Personal history of malignant neoplasm of breast: Secondary | ICD-10-CM

## 2014-12-05 ENCOUNTER — Ambulatory Visit
Admission: RE | Admit: 2014-12-05 | Discharge: 2014-12-05 | Disposition: A | Discharge status: Home / Self Care | Payer: 59 | Source: Ambulatory Visit | Attending: Oncology | Admitting: Oncology

## 2014-12-05 DIAGNOSIS — Z853 Personal history of malignant neoplasm of breast: Secondary | ICD-10-CM

## 2015-09-25 ENCOUNTER — Other Ambulatory Visit: Payer: Self-pay

## 2015-09-25 ENCOUNTER — Other Ambulatory Visit: Payer: Self-pay | Admitting: Oncology

## 2015-09-25 DIAGNOSIS — Z9889 Other specified postprocedural states: Secondary | ICD-10-CM

## 2015-09-25 DIAGNOSIS — Z853 Personal history of malignant neoplasm of breast: Secondary | ICD-10-CM

## 2015-12-11 ENCOUNTER — Ambulatory Visit
Admission: RE | Admit: 2015-12-11 | Discharge: 2015-12-11 | Disposition: A | Discharge status: Home / Self Care | Payer: 59 | Source: Ambulatory Visit | Attending: Oncology | Admitting: Oncology

## 2015-12-11 DIAGNOSIS — Z9889 Other specified postprocedural states: Secondary | ICD-10-CM

## 2015-12-11 DIAGNOSIS — Z853 Personal history of malignant neoplasm of breast: Secondary | ICD-10-CM

## 2016-11-18 ENCOUNTER — Other Ambulatory Visit: Payer: Self-pay | Admitting: Family Medicine

## 2016-11-18 DIAGNOSIS — Z1231 Encounter for screening mammogram for malignant neoplasm of breast: Secondary | ICD-10-CM

## 2016-12-26 ENCOUNTER — Ambulatory Visit
Admission: RE | Admit: 2016-12-26 | Discharge: 2016-12-26 | Disposition: A | Discharge status: Home / Self Care | Payer: 59 | Source: Ambulatory Visit | Attending: Family Medicine | Admitting: Family Medicine

## 2016-12-26 DIAGNOSIS — Z1231 Encounter for screening mammogram for malignant neoplasm of breast: Secondary | ICD-10-CM

## 2017-04-06 DIAGNOSIS — G4700 Insomnia, unspecified: Secondary | ICD-10-CM | POA: Diagnosis not present

## 2017-04-06 DIAGNOSIS — Z13220 Encounter for screening for lipoid disorders: Secondary | ICD-10-CM | POA: Diagnosis not present

## 2017-04-06 DIAGNOSIS — R7301 Impaired fasting glucose: Secondary | ICD-10-CM | POA: Diagnosis not present

## 2017-04-06 DIAGNOSIS — I10 Essential (primary) hypertension: Secondary | ICD-10-CM | POA: Diagnosis not present

## 2017-04-06 DIAGNOSIS — J301 Allergic rhinitis due to pollen: Secondary | ICD-10-CM | POA: Diagnosis not present

## 2017-11-23 ENCOUNTER — Other Ambulatory Visit: Payer: Self-pay | Admitting: Family Medicine

## 2017-11-23 DIAGNOSIS — Z1231 Encounter for screening mammogram for malignant neoplasm of breast: Secondary | ICD-10-CM

## 2017-12-28 ENCOUNTER — Ambulatory Visit
Admission: RE | Admit: 2017-12-28 | Discharge: 2017-12-28 | Disposition: A | Discharge status: Home / Self Care | Payer: 59 | Source: Ambulatory Visit | Attending: Family Medicine | Admitting: Family Medicine

## 2017-12-28 DIAGNOSIS — Z1231 Encounter for screening mammogram for malignant neoplasm of breast: Secondary | ICD-10-CM

## 2017-12-28 HISTORY — DX: Personal history of irradiation: Z92.3

## 2018-01-22 DIAGNOSIS — J111 Influenza due to unidentified influenza virus with other respiratory manifestations: Secondary | ICD-10-CM | POA: Diagnosis not present

## 2018-08-04 DIAGNOSIS — I10 Essential (primary) hypertension: Secondary | ICD-10-CM | POA: Diagnosis not present

## 2018-08-04 DIAGNOSIS — R7303 Prediabetes: Secondary | ICD-10-CM | POA: Diagnosis not present

## 2018-08-04 DIAGNOSIS — G4700 Insomnia, unspecified: Secondary | ICD-10-CM | POA: Diagnosis not present

## 2018-12-15 ENCOUNTER — Other Ambulatory Visit: Payer: Self-pay | Admitting: Family Medicine

## 2018-12-15 DIAGNOSIS — Z1231 Encounter for screening mammogram for malignant neoplasm of breast: Secondary | ICD-10-CM

## 2019-01-13 ENCOUNTER — Ambulatory Visit
Admission: RE | Admit: 2019-01-13 | Discharge: 2019-01-13 | Disposition: A | Discharge status: Home / Self Care | Payer: Managed Care, Other (non HMO) | Source: Ambulatory Visit | Attending: Family Medicine | Admitting: Family Medicine

## 2019-01-13 DIAGNOSIS — Z1231 Encounter for screening mammogram for malignant neoplasm of breast: Secondary | ICD-10-CM

## 2019-10-04 ENCOUNTER — Other Ambulatory Visit: Payer: Self-pay | Admitting: Family Medicine

## 2019-10-04 DIAGNOSIS — Z1231 Encounter for screening mammogram for malignant neoplasm of breast: Secondary | ICD-10-CM

## 2020-01-20 ENCOUNTER — Other Ambulatory Visit: Payer: Self-pay

## 2020-01-20 ENCOUNTER — Ambulatory Visit
Admission: RE | Admit: 2020-01-20 | Discharge: 2020-01-20 | Disposition: A | Discharge status: Home / Self Care | Payer: Managed Care, Other (non HMO) | Source: Ambulatory Visit | Attending: Family Medicine | Admitting: Family Medicine

## 2020-01-20 DIAGNOSIS — Z1231 Encounter for screening mammogram for malignant neoplasm of breast: Secondary | ICD-10-CM

## 2020-10-03 ENCOUNTER — Other Ambulatory Visit: Payer: Self-pay | Admitting: Family Medicine

## 2020-10-03 DIAGNOSIS — Z1231 Encounter for screening mammogram for malignant neoplasm of breast: Secondary | ICD-10-CM

## 2021-01-21 ENCOUNTER — Other Ambulatory Visit: Payer: Self-pay

## 2021-01-21 ENCOUNTER — Ambulatory Visit
Admission: RE | Admit: 2021-01-21 | Discharge: 2021-01-21 | Disposition: A | Discharge status: Home / Self Care | Payer: Managed Care, Other (non HMO) | Source: Ambulatory Visit | Attending: Family Medicine | Admitting: Family Medicine

## 2021-01-21 DIAGNOSIS — Z1231 Encounter for screening mammogram for malignant neoplasm of breast: Secondary | ICD-10-CM

## 2021-10-15 ENCOUNTER — Other Ambulatory Visit: Payer: Self-pay | Admitting: Family Medicine

## 2021-10-15 DIAGNOSIS — Z1231 Encounter for screening mammogram for malignant neoplasm of breast: Secondary | ICD-10-CM

## 2022-01-23 ENCOUNTER — Ambulatory Visit: Payer: Managed Care, Other (non HMO)

## 2022-01-24 ENCOUNTER — Other Ambulatory Visit: Payer: Self-pay

## 2022-01-24 ENCOUNTER — Ambulatory Visit
Admission: RE | Admit: 2022-01-24 | Discharge: 2022-01-24 | Disposition: A | Discharge status: Home / Self Care | Payer: Managed Care, Other (non HMO) | Source: Ambulatory Visit | Attending: Family Medicine | Admitting: Family Medicine

## 2022-01-24 DIAGNOSIS — Z1231 Encounter for screening mammogram for malignant neoplasm of breast: Secondary | ICD-10-CM

## 2022-01-27 ENCOUNTER — Other Ambulatory Visit: Payer: Self-pay | Admitting: Family Medicine

## 2022-01-27 DIAGNOSIS — R928 Other abnormal and inconclusive findings on diagnostic imaging of breast: Secondary | ICD-10-CM

## 2022-02-20 ENCOUNTER — Ambulatory Visit: Payer: Managed Care, Other (non HMO)

## 2022-02-20 ENCOUNTER — Other Ambulatory Visit: Payer: Self-pay | Admitting: Family Medicine

## 2022-02-20 ENCOUNTER — Ambulatory Visit: Admission: RE | Admit: 2022-02-20 | Payer: Managed Care, Other (non HMO) | Source: Ambulatory Visit

## 2022-02-20 ENCOUNTER — Ambulatory Visit
Admission: RE | Admit: 2022-02-20 | Discharge: 2022-02-20 | Disposition: A | Discharge status: Home / Self Care | Payer: Managed Care, Other (non HMO) | Source: Ambulatory Visit | Attending: Family Medicine | Admitting: Family Medicine

## 2022-03-06 ENCOUNTER — Ambulatory Visit
Admission: RE | Admit: 2022-03-06 | Discharge: 2022-03-06 | Disposition: A | Discharge status: Home / Self Care | Payer: Managed Care, Other (non HMO) | Source: Ambulatory Visit | Attending: Family Medicine | Admitting: Family Medicine

## 2022-03-06 DIAGNOSIS — R921 Mammographic calcification found on diagnostic imaging of breast: Secondary | ICD-10-CM

## 2023-01-06 ENCOUNTER — Other Ambulatory Visit: Payer: Self-pay | Admitting: Family Medicine

## 2023-01-06 DIAGNOSIS — Z1231 Encounter for screening mammogram for malignant neoplasm of breast: Secondary | ICD-10-CM

## 2023-02-11 ENCOUNTER — Ambulatory Visit
Admission: RE | Admit: 2023-02-11 | Discharge: 2023-02-11 | Disposition: A | Discharge status: Home / Self Care | Payer: Managed Care, Other (non HMO) | Source: Ambulatory Visit | Attending: Family Medicine | Admitting: Family Medicine

## 2023-02-11 ENCOUNTER — Other Ambulatory Visit: Payer: Self-pay | Admitting: Family Medicine

## 2023-02-11 DIAGNOSIS — R053 Chronic cough: Secondary | ICD-10-CM

## 2023-02-23 ENCOUNTER — Ambulatory Visit
Admission: RE | Admit: 2023-02-23 | Discharge: 2023-02-23 | Disposition: A | Discharge status: Home / Self Care | Payer: Managed Care, Other (non HMO) | Source: Ambulatory Visit | Attending: Family Medicine | Admitting: Family Medicine

## 2023-02-23 DIAGNOSIS — Z1231 Encounter for screening mammogram for malignant neoplasm of breast: Secondary | ICD-10-CM

## 2023-03-16 IMAGING — MG MM BREAST LOCALIZATION CLIP
4 series · 4 of 12 positions shown · non-contrast
Comparison: Previous exam(s).

CLINICAL DATA: Evaluate placement of X shaped biopsy clips
following 3D/stereotactic guided bilateral breast biopsies.

EXAM:
3D DIAGNOSTIC BILATERAL MAMMOGRAM POST STEREOTACTIC BIOPSY

[R ML synth-2D]
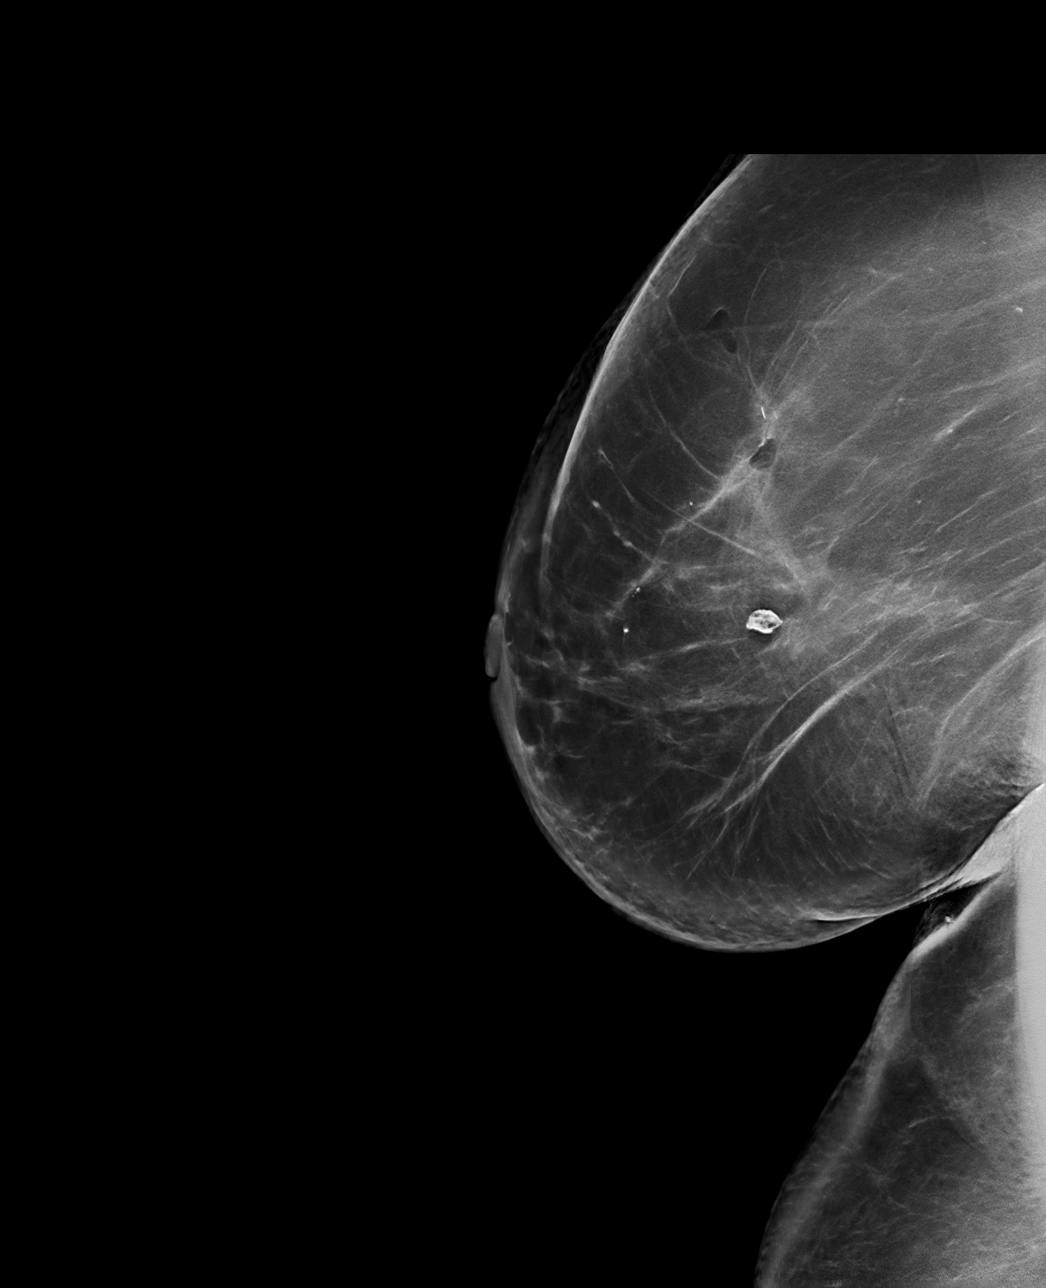

[R CC synth-2D]
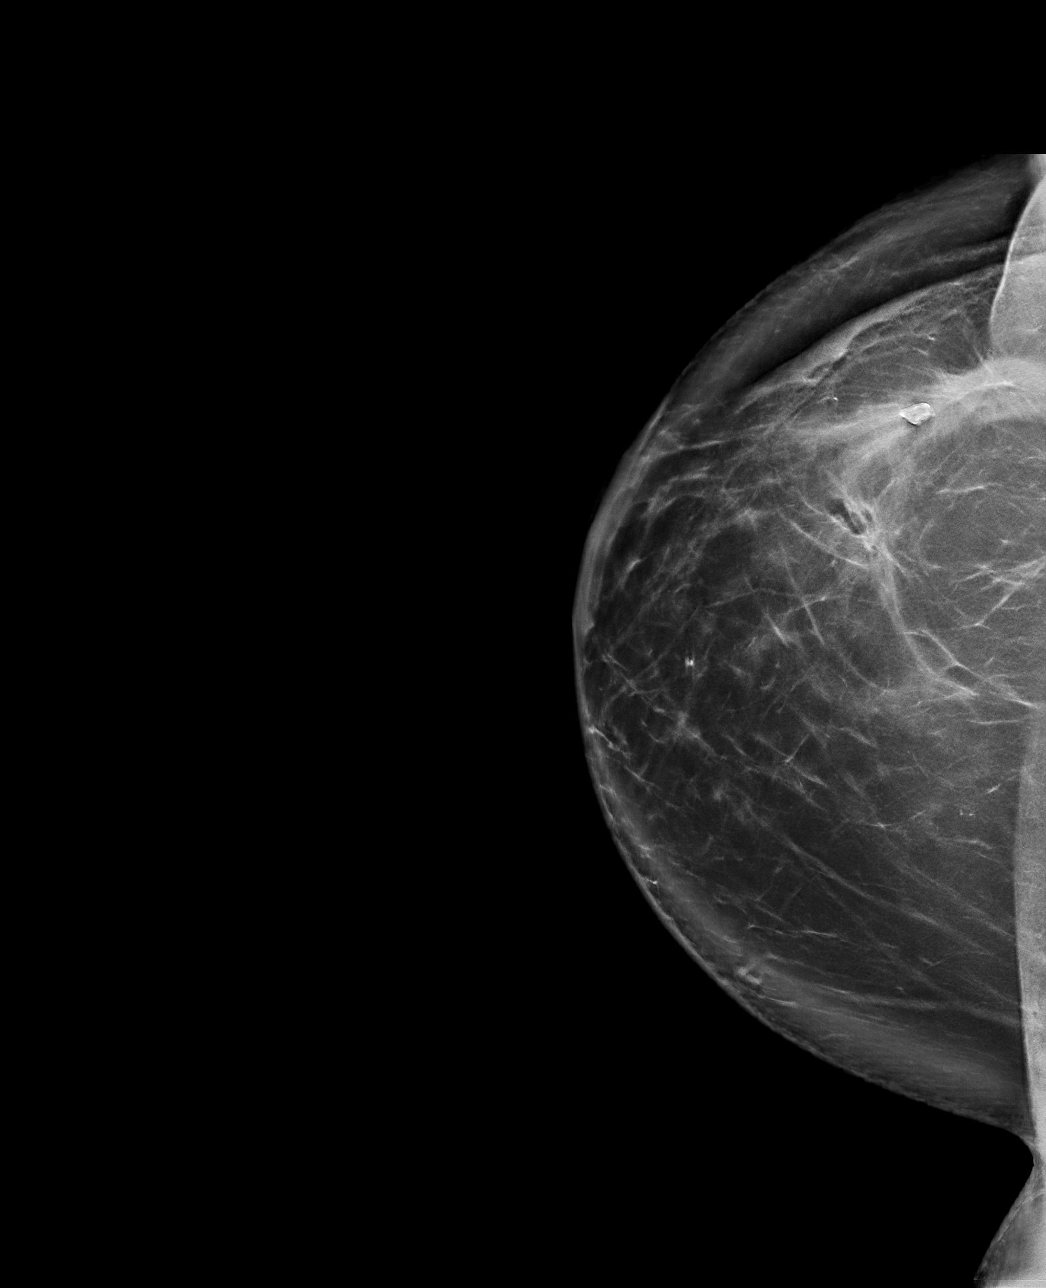

[R CC tomo · tomo slice 54/107.0]
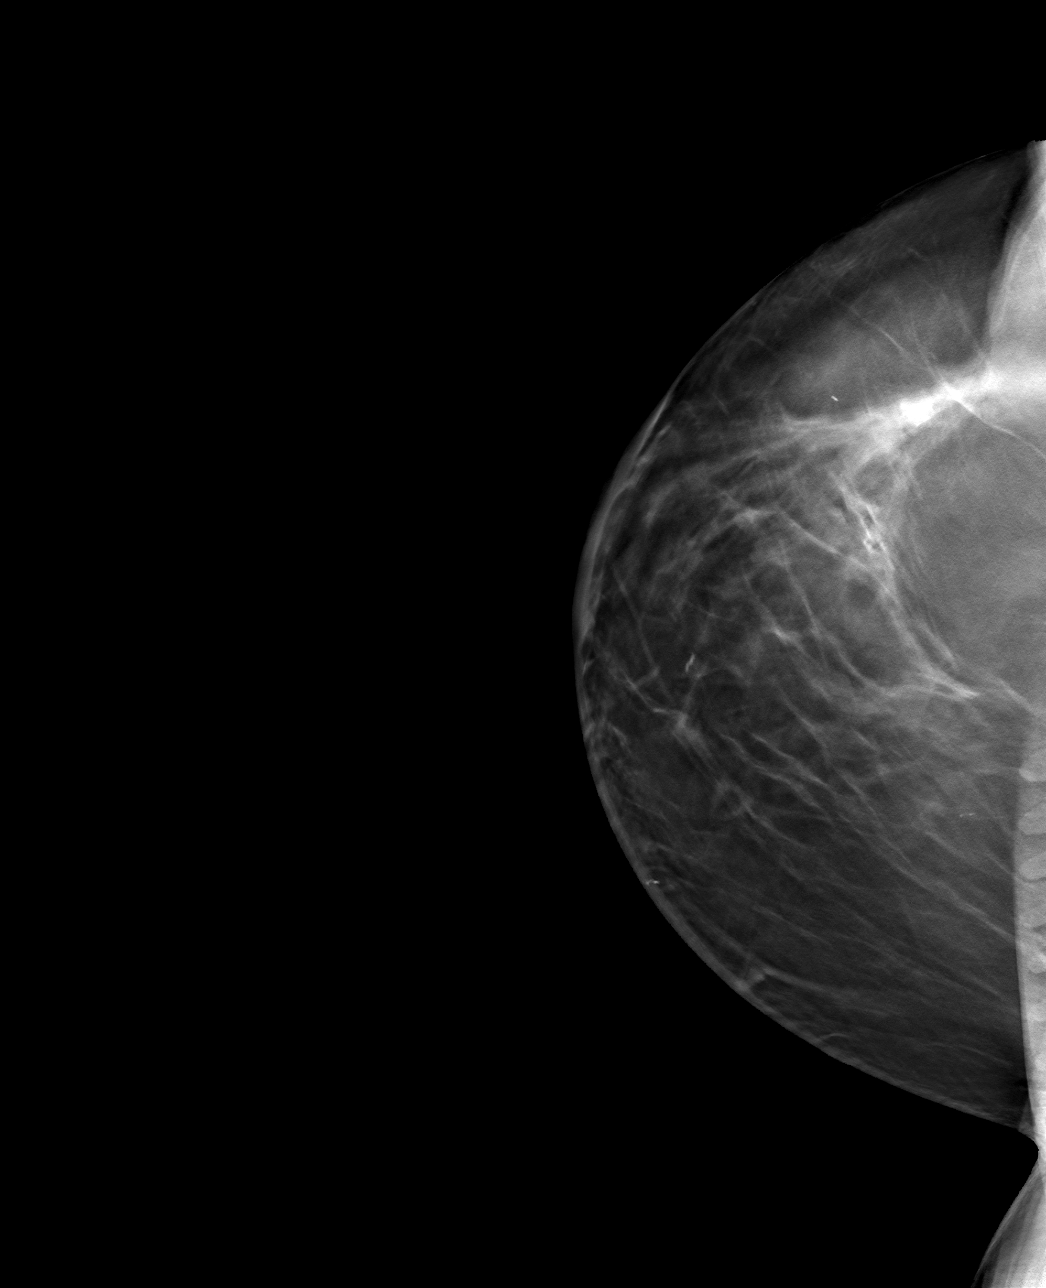

[R ML tomo · tomo slice 63/124.0]
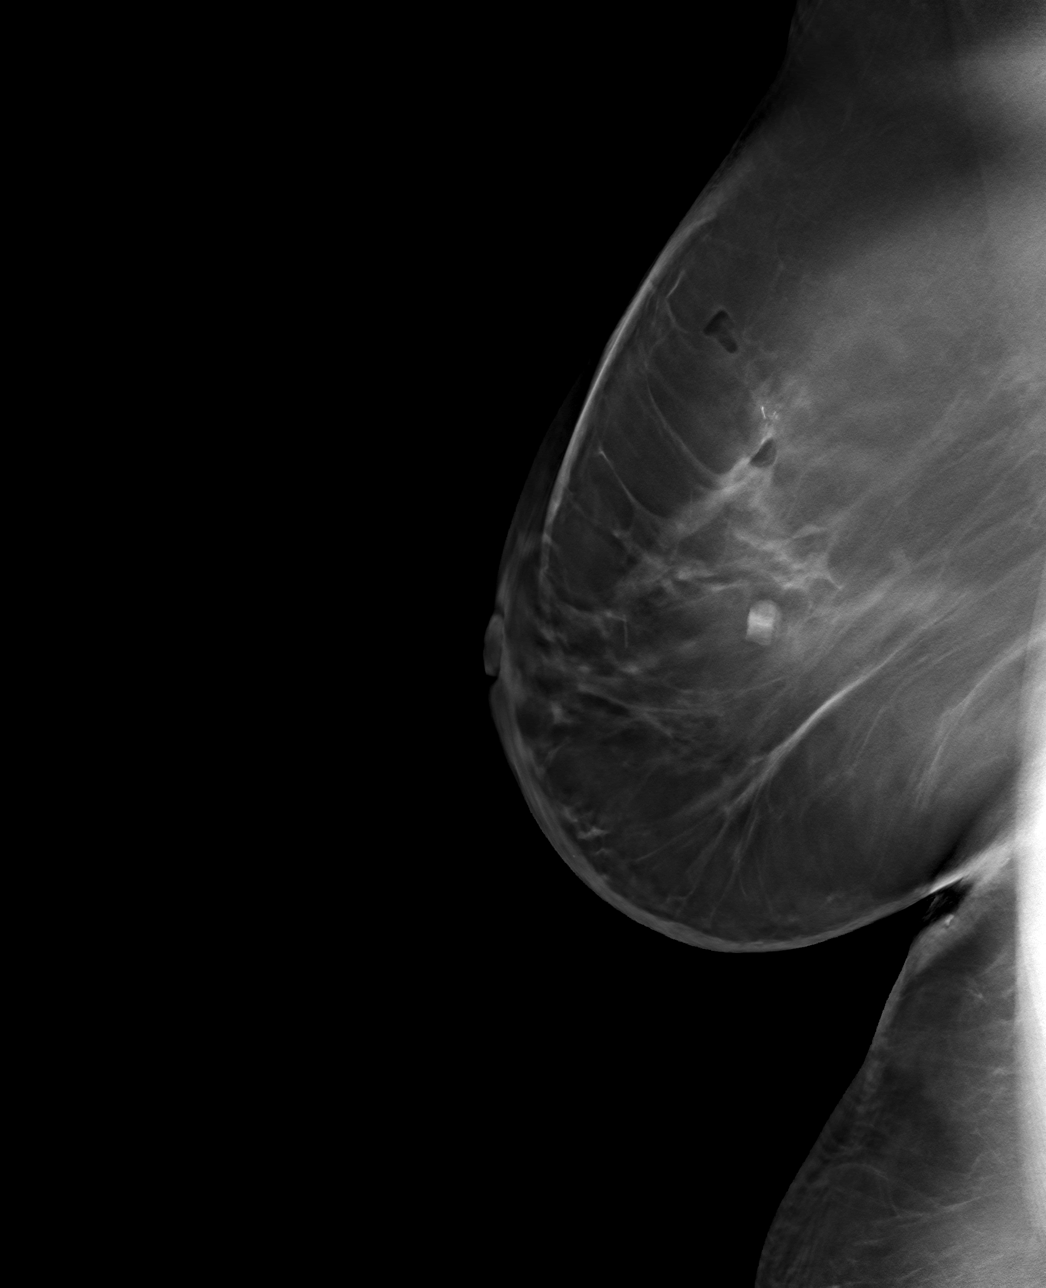

[4 of 12 positions shown; findings below may reference images not displayed]

FINDINGS: 3D Mammographic images were obtained following 3D/stereotactic
guided biopsy of 1 cm group of UPPER RIGHT breast calcifications.
The X biopsy marking clip is in expected position at the site of
biopsy.

3D Mammographic images were obtained following 3D/stereotactic
guided biopsy of 0.5 cm group of UPPER INNER LEFT breast
calcifications. The X biopsy marking clip is in expected position at
the site of biopsy.
IMPRESSION: Appropriate positioning of the X shaped biopsy marking clip at the
site of biopsy in the UPPER RIGHT breast.

Appropriate positioning of the X shaped biopsy marking clip at the
site of biopsy in the UPPER INNER LEFT breast.

Final Assessment: Post Procedure Mammograms for Marker Placement

## 2023-03-16 IMAGING — MG MM BREAST BX W/ LOC DEV 1ST LESION IMAGE BX SPEC STEREO GUIDE*R*
7 of 11 series · 7 of 23 positions shown · non-contrast
Comparison: Previous exams.
COMPARISON: Previous exams.

Addendum:
CLINICAL DATA: 65-year-old female for 3D/stereotactic guided biopsy
of 1 cm group of UPPER RIGHT breast calcifications and 0.5 cm group
of UPPER INNER LEFT breast calcifications.

EXAM:
RIGHT BREAST STEREOTACTIC CORE NEEDLE BIOPSY
LEFT BREAST STEREOTACTIC CORE NEEDLE BIOPSY

[R (1 of 7)]
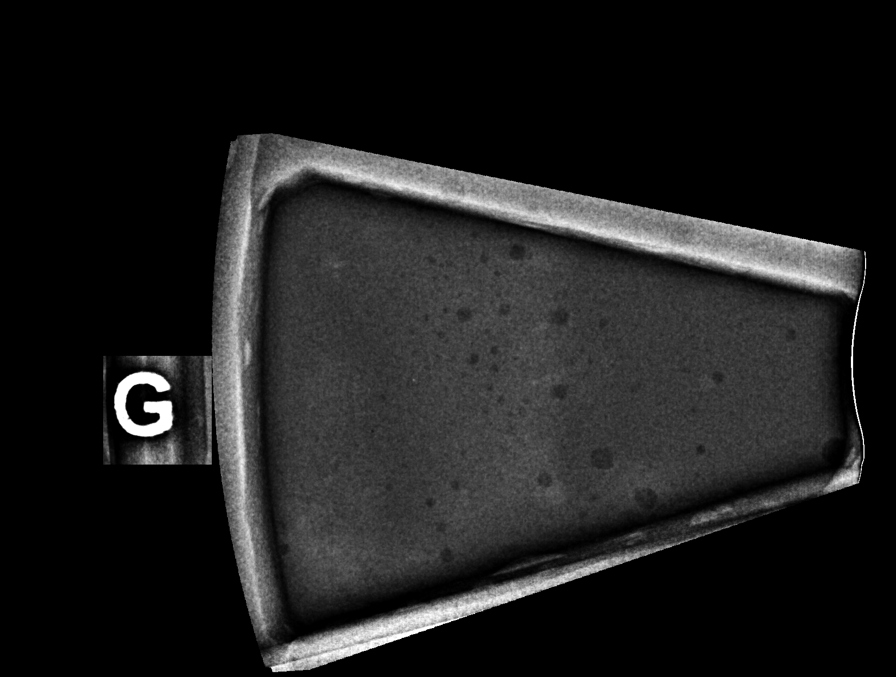

[R (2 of 7)]
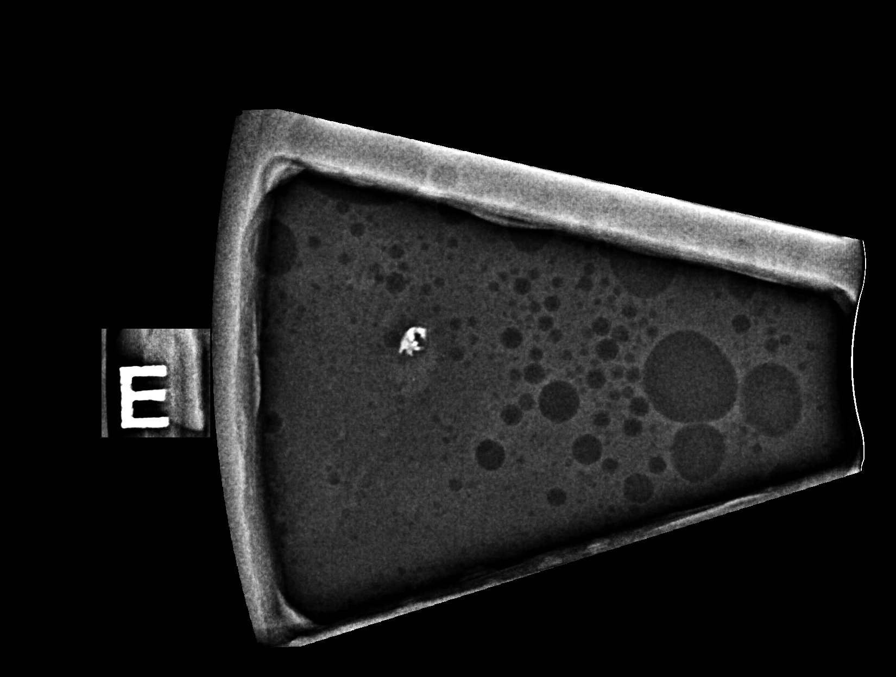

[R (3 of 7)]
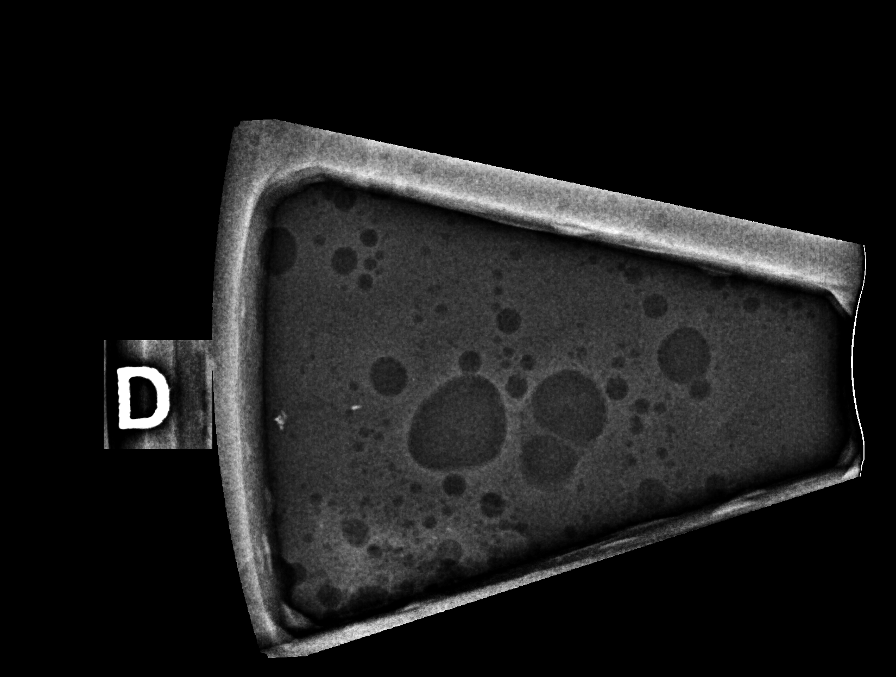

[R (4 of 7)]
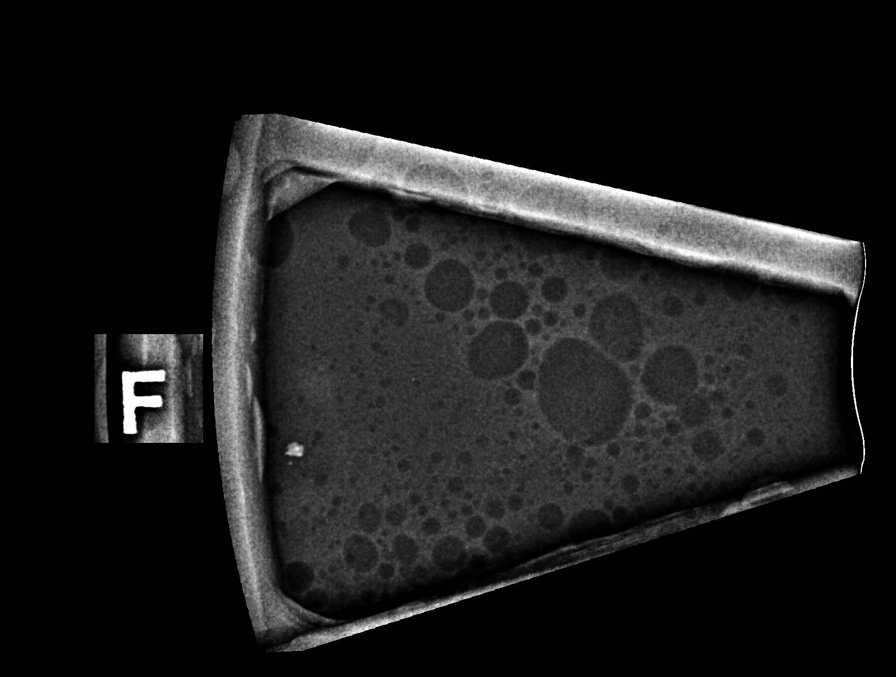

[R (5 of 7)]
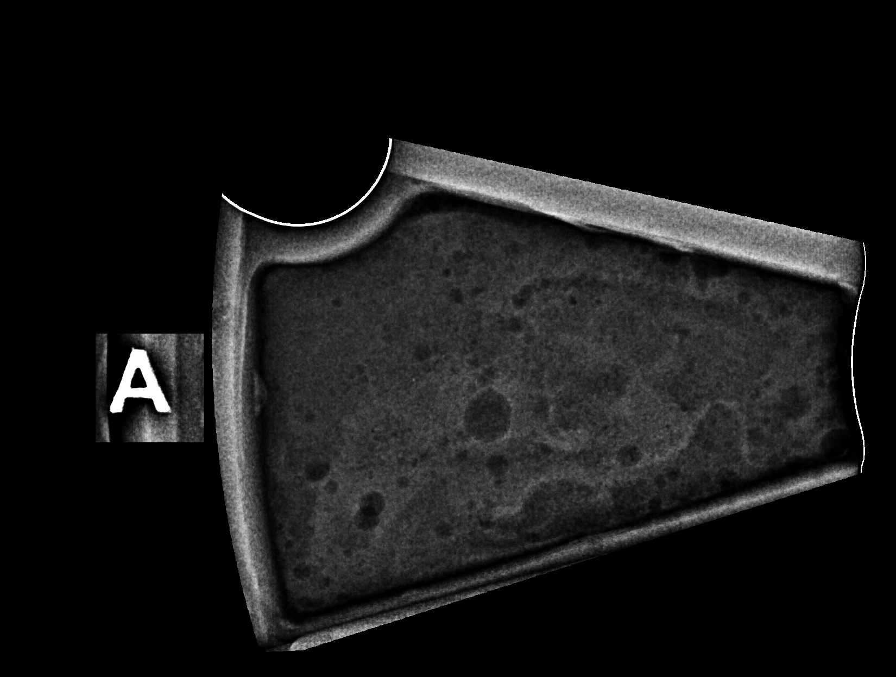

[R (6 of 7)]
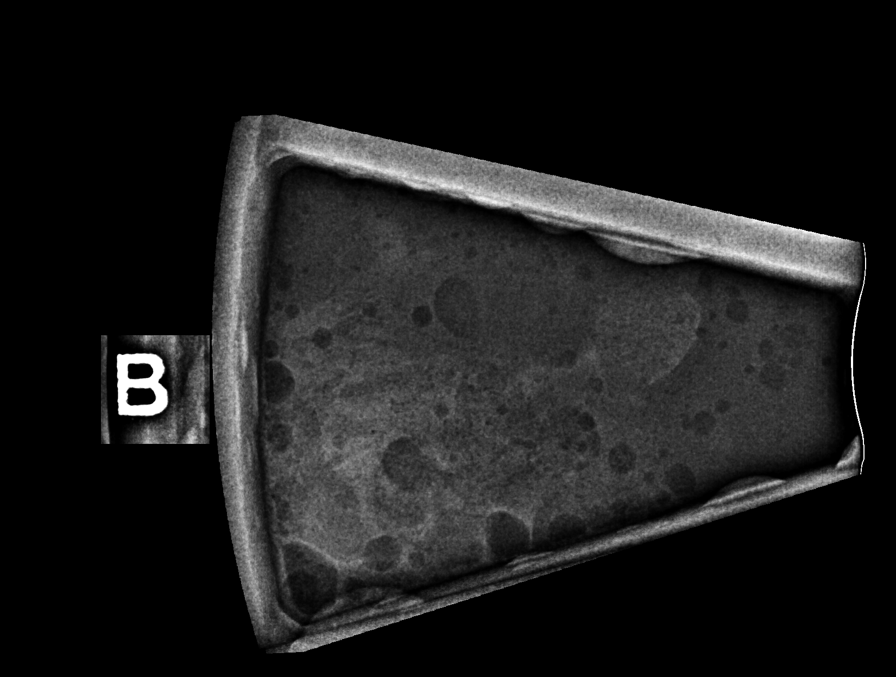

[R (7 of 7)]
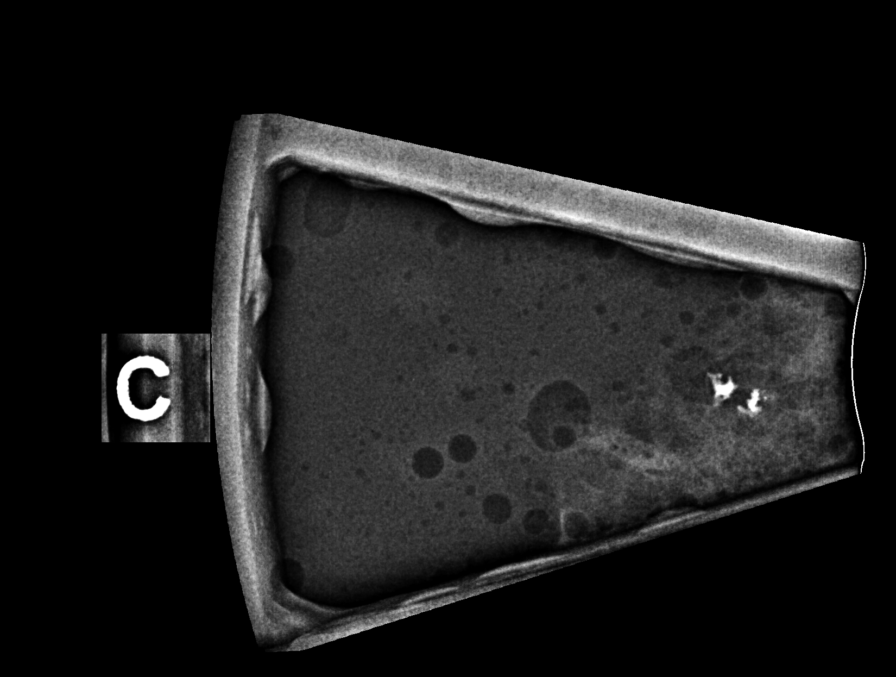

[7 of 23 positions shown; findings below may reference images not displayed]

FINDINGS: The patient and I discussed the procedure of stereotactic-guided
biopsy including benefits and alternatives. We discussed the high
likelihood of successful procedures. We discussed the risks of the
procedure including infection, bleeding, tissue injury, clip
migration, and inadequate sampling. Informed written consent was
given. The usual time out protocol was performed immediately prior
to the procedures.

RIGHT BREAST STEREOTACTIC CORE NEEDLE BIOPSY

Using sterile technique and 1% Lidocaine with and without
epinephrine as local anesthetic, under stereotactic guidance, a 9
gauge vacuum assisted device was used to perform core needle biopsy
of 1 cm group of UPPER RIGHT breast calcifications using a SUPERIOR
approach. Specimen radiograph was performed showing calcifications.
Specimens with calcifications are identified for pathology.

At the conclusion of the procedure, an X shaped tissue marker clip
was deployed into the biopsy cavity. Follow-up 2-view mammogram was
performed and dictated separately.

LEFT BREAST STEREOTACTIC CORE NEEDLE BIOPSY

Using sterile technique and 1% Lidocaine with and without
epinephrine as local anesthetic, under stereotactic guidance, a 9
gauge vacuum assisted device was used to perform core needle biopsy
of 0.5 cm group of UPPER INNER LEFT breast calcifications using a
SUPERIOR approach. Specimen radiograph was performed showing
calcifications. Specimens with calcifications are identified for
pathology.

At the conclusion of the procedure, an X shaped tissue marker clip
was deployed into the biopsy cavity. Follow-up 2-view mammogram was
performed and dictated separately.
IMPRESSION: Stereotactic-guided biopsy of 1 cm group of UPPER RIGHT breast
calcifications.

Stereotactic guided biopsy of 0.5 cm group of UPPER INNER LEFT
breast calcifications.

No apparent complications.

ADDENDUM:
Pathology revealed SCLEROTIC FIBROADENOMATOID NODULE WITH
CALCIFICATIONS of the RIGHT breast, upper, (x clip). This was found
to be concordant by Dr. Tzuri Wari.

Pathology revealed SCLEROTIC FIBROADENOMATOID NODULE WITH
CALCIFICATIONS of the LEFT breast, upper inner, (x clip). This was
found to be concordant by Dr. Tzuri Wari.

Pathology results were discussed with the patient by telephone. The
patient reported doing well after the biopsies with tenderness at
the sites. Post biopsy instructions and care were reviewed and
questions were answered. The patient was encouraged to call The
direct phone number was provided.

The patient was instructed to return for annual screening
mammography in January 2023.

Pathology results reported by Edneudo Gabrielle, RN on 03/07/2022.

*** End of Addendum ***
FINDINGS: The patient and I discussed the procedure of stereotactic-guided
biopsy including benefits and alternatives. We discussed the high
likelihood of successful procedures. We discussed the risks of the
procedure including infection, bleeding, tissue injury, clip
migration, and inadequate sampling. Informed written consent was
given. The usual time out protocol was performed immediately prior
to the procedures.

RIGHT BREAST STEREOTACTIC CORE NEEDLE BIOPSY

Using sterile technique and 1% Lidocaine with and without
epinephrine as local anesthetic, under stereotactic guidance, a 9
gauge vacuum assisted device was used to perform core needle biopsy
of 1 cm group of UPPER RIGHT breast calcifications using a SUPERIOR
approach. Specimen radiograph was performed showing calcifications.
Specimens with calcifications are identified for pathology.

At the conclusion of the procedure, an X shaped tissue marker clip
was deployed into the biopsy cavity. Follow-up 2-view mammogram was
performed and dictated separately.

LEFT BREAST STEREOTACTIC CORE NEEDLE BIOPSY

Using sterile technique and 1% Lidocaine with and without
epinephrine as local anesthetic, under stereotactic guidance, a 9
gauge vacuum assisted device was used to perform core needle biopsy
of 0.5 cm group of UPPER INNER LEFT breast calcifications using a
SUPERIOR approach. Specimen radiograph was performed showing
calcifications. Specimens with calcifications are identified for
pathology.

At the conclusion of the procedure, an X shaped tissue marker clip
was deployed into the biopsy cavity. Follow-up 2-view mammogram was
performed and dictated separately.
IMPRESSION: Stereotactic-guided biopsy of 1 cm group of UPPER RIGHT breast
calcifications.

Stereotactic guided biopsy of 0.5 cm group of UPPER INNER LEFT
breast calcifications.

No apparent complications.

## 2023-10-30 ENCOUNTER — Other Ambulatory Visit: Payer: Self-pay | Admitting: Family Medicine

## 2023-10-30 DIAGNOSIS — Z1231 Encounter for screening mammogram for malignant neoplasm of breast: Secondary | ICD-10-CM

## 2024-02-26 ENCOUNTER — Ambulatory Visit
Admission: RE | Admit: 2024-02-26 | Discharge: 2024-02-26 | Disposition: A | Discharge status: Home / Self Care | Payer: Managed Care, Other (non HMO) | Source: Ambulatory Visit | Attending: Family Medicine | Admitting: Family Medicine

## 2024-02-26 DIAGNOSIS — Z1231 Encounter for screening mammogram for malignant neoplasm of breast: Secondary | ICD-10-CM

## 2024-11-28 ENCOUNTER — Other Ambulatory Visit: Payer: Self-pay | Admitting: Family Medicine

## 2024-11-28 DIAGNOSIS — Z1231 Encounter for screening mammogram for malignant neoplasm of breast: Secondary | ICD-10-CM

## 2025-03-03 ENCOUNTER — Ambulatory Visit
